# Patient Record
Sex: Female | Born: 1960 | Race: White | Hispanic: No | Marital: Married | Smoking: Never smoker
Health system: Southern US, Community
[De-identification: ages and names within clinical notes are randomized; demographics above are authoritative.]

## PROBLEM LIST (undated history)

## (undated) DIAGNOSIS — C801 Malignant (primary) neoplasm, unspecified: Secondary | ICD-10-CM

## (undated) DIAGNOSIS — Z973 Presence of spectacles and contact lenses: Secondary | ICD-10-CM

## (undated) HISTORY — PX: BREAST SURGERY: SHX581

## (undated) HISTORY — PX: WISDOM TOOTH EXTRACTION: SHX21

## (undated) HISTORY — PX: GUM SURGERY: SHX658

## (undated) HISTORY — DX: Malignant (primary) neoplasm, unspecified: C80.1

## (undated) HISTORY — DX: Presence of spectacles and contact lenses: Z97.3

## (undated) HISTORY — PX: DILATION AND CURETTAGE OF UTERUS: SHX78

---

## 2001-02-02 ENCOUNTER — Encounter: Payer: Self-pay | Admitting: Obstetrics and Gynecology

## 2001-02-02 ENCOUNTER — Encounter: Admission: RE | Admit: 2001-02-02 | Discharge: 2001-02-02 | Payer: Self-pay | Admitting: Obstetrics and Gynecology

## 2002-09-30 ENCOUNTER — Other Ambulatory Visit: Admission: RE | Admit: 2002-09-30 | Discharge: 2002-09-30 | Payer: Self-pay | Admitting: Obstetrics and Gynecology

## 2008-08-08 HISTORY — PX: WRIST SURGERY: SHX841

## 2011-03-21 ENCOUNTER — Other Ambulatory Visit: Payer: Self-pay | Admitting: Radiology

## 2011-03-22 ENCOUNTER — Other Ambulatory Visit: Payer: Self-pay | Admitting: Radiology

## 2011-03-22 DIAGNOSIS — C50912 Malignant neoplasm of unspecified site of left female breast: Secondary | ICD-10-CM

## 2011-03-28 ENCOUNTER — Other Ambulatory Visit: Payer: Self-pay

## 2011-03-28 ENCOUNTER — Ambulatory Visit (INDEPENDENT_AMBULATORY_CARE_PROVIDER_SITE_OTHER): Payer: BC Managed Care – PPO | Admitting: General Surgery

## 2011-03-28 ENCOUNTER — Other Ambulatory Visit (INDEPENDENT_AMBULATORY_CARE_PROVIDER_SITE_OTHER): Payer: Self-pay | Admitting: General Surgery

## 2011-03-28 ENCOUNTER — Encounter (INDEPENDENT_AMBULATORY_CARE_PROVIDER_SITE_OTHER): Payer: Self-pay | Admitting: General Surgery

## 2011-03-28 ENCOUNTER — Ambulatory Visit
Admission: RE | Admit: 2011-03-28 | Discharge: 2011-03-28 | Disposition: A | Discharge status: Home / Self Care | Payer: Self-pay | Source: Ambulatory Visit | Attending: Radiology | Admitting: Radiology

## 2011-03-28 VITALS — BP 128/76 | HR 62 | Temp 97.6°F

## 2011-03-28 DIAGNOSIS — C50319 Malignant neoplasm of lower-inner quadrant of unspecified female breast: Secondary | ICD-10-CM | POA: Insufficient documentation

## 2011-03-28 DIAGNOSIS — C50912 Malignant neoplasm of unspecified site of left female breast: Secondary | ICD-10-CM

## 2011-03-28 MED ORDER — GADOBENATE DIMEGLUMINE 529 MG/ML IV SOLN
11.0000 mL | Freq: Once | INTRAVENOUS | Status: AC | PRN
  Administered 2011-03-28: 11 mL via INTRAVENOUS

## 2011-03-28 NOTE — Progress Notes (Signed)
Chief Complaint  Patient presents with  . Other    new pt- breast cancer in left breast    HPI [name] is a 50 y.o. female.   HPI This is a 50 year old female who presents with a new diagnosis of a left breast cancer. She is very healthy. She does have a family history her mother age 106 of breast cancer. She underwent a lumpectomy and has been alive now for 20 years after her surgery. She underwent a screening mammogram on 03/16/2011. This showed a potential abnormality in the left breast that looked like it was in the medial portion of the breast. She then underwent spot compression views which showed it to be in the upper medial portion of the left breast. Ultrasound showed this to be a 4-5 mm irregularly marginated nodule at the 10:00 position in the left breast. She underwent an ultrasound-guided needle core biopsy. A clip was placed at the time. 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 She underwent additional stains with each adherence showing a ductal phenotype. This is a low-grade cancer. HER-2/neu is not amplified and her estrogen and progesterone receptors are both positive at 97%. Her Ki-67 is 15%. She reports no problems referable to her breasts. Her husband is a Sports administrator at [employer].  Past Medical History  Diagnosis Date  . Mammogram abnormal   . Wears glasses   . Cancer     breast    Past Surgical History  Procedure Date  . Wrist surgery 2010    repaired with plate and screws  . Dilation and curettage of uterus   . Wisdom tooth extraction   . Gum surgery     Family History  Problem Relation Age of Onset  . Cancer Mother     breast  . Cancer Father     prostate   . Hyperlipidemia Father   . Other Father     vertigo    Social History History  Substance Use Topics  . Smoking status: Never Smoker   . Smokeless tobacco: Not on file  . Alcohol Use: 8.4 oz/week    7 Glasses of wine, 7 Shots of liquor per week    No Known  Allergies  Current Outpatient Prescriptions  Medication Sig Dispense Refill  . aspirin 81 MG tablet Take 81 mg by mouth daily.        . Calcium Carbonate-Vitamin D (CALCIUM 600 + D PO) Take by mouth daily.        Marland Kitchen doxycycline (VIBRA-TABS) 100 MG tablet Every other day.      . fish oil-omega-3 fatty acids 1000 MG capsule Take 1,200 mg by mouth daily.        . hydroquinone 4 % cream Apply topically daily.        . Multiple Vitamin (MULTIVITAMIN PO) Take by mouth daily.        Kathrynn Running Estrad-Fe Biphas (LO LOESTRIN FE PO) Take by mouth daily. Pt temporarily not taking until blood work is done         Review of Systems Review of Systems  Constitutional: Negative.   HENT: Negative.   Eyes: Negative.   Respiratory: Negative.   Cardiovascular: Negative.   Gastrointestinal: Negative.   Genitourinary: Negative.   Musculoskeletal: Negative.   Skin: Negative.   Neurological: Negative.   Endo/Heme/Allergies: Negative.   Psychiatric/Behavioral: Negative.     Blood pressure 128/76, pulse 62, temperature 97.6 F (36.4 C).  Physical Exam Physical Exam  Constitutional: She appears  well-developed and well-nourished.  Neck: Neck supple. No thyromegaly present.  Cardiovascular: Normal rate, regular rhythm and normal heart sounds.   Respiratory: Effort normal and breath sounds normal. She has no wheezes. She has no rales. Right breast exhibits no inverted nipple, no mass, no nipple discharge, no skin change and no tenderness. Left breast exhibits no inverted nipple, no mass, no nipple discharge, no skin change and no tenderness. Breasts are symmetrical.    GI: Soft. She exhibits no mass.  Lymphadenopathy:    She has no cervical adenopathy.       Assessment    Early stage left breast cancer    Plan       We discussed the staging and pathophysiology of her breast cancer. There both very well first and breast cancer as her husband is a Sports administrator. She does have what looks like  a clinically early stage left breast cancer. We discussed several options from here.  I first recommended genetic testing for BRCA1 and BRCA2. Due to her family history as well as her young age I think this would be a reasonable plan. This would be important for both her treatment as well as the fact that she has 2 daughters. She has agreed to undergo genetic testing I will refer her to the cancer center. I'm going to try to see if they can draw all of her blood for her preop lab testing as well as some blood they were due to draw at her gynecologist this Friday as well.  We also discussed an MRI. She was scheduled today for an MRI prior to coming here. I think it would be reasonable to obtain an MRI on her due to the fact that she does have somewhat dense breasts, her young age, and her family history. We discussed the limitations as well as the complicating factors that can occur after an MRI. We all agreed it would be reasonable to proceed with the MRI that is scheduled. She is fully aware that this could lead to more biopsies and could just confuse the picture and may have no impact on survival.  I discussed all of the different treatment options for breast cancer including surgery, chemotherapy, radiation therapy, and anti estrogen therapy. We discussed all of these are options at this point. I told her that surgery is going to be her first treatment as it does appear she has an early stage breast cancer and the decision for all other therapies will be based on the staging after surgery. We discussed oncotype also.  I discussed with her  the role of sentinel node biopsy. I recommended this procedure to her. We discussed the performance of this procedure. I told her I would not do intraoperative pathology at the time that we would wait for the final results. We discussed about a 1-2% risk of lymphedema as well as chronic shoulder pain. I discussed there is a small chance she would need to return to the  operating room for a formal axillary lymph node dissection.  We discussed lumpectomy versus mastectomy. I think she is a good candidate for a lumpectomy. I discussed a 10-15% positive margin rate require requiring reexcision in the operating room. I also discussed 100% need for radiation therapy following lumpectomy due to the recurrence rates. I also discussed a mastectomy. We discussed a small difference in the local recurrence rate after lumpectomy and radiation versus a mastectomy. I also discussed there was no difference in her survival rate between the 2 surgeries.  She would very much like to undergo a lumpectomy and will plan on scheduling for that as well as a sentinel lymph node biopsy was beginning to genetic testing completed. We also discussed external beam radiation therapy versus MammoSite. We have elected to undergo standard radiation therapy.    Indya Oliveria 03/28/2011, 12:33 PM

## 2011-04-04 ENCOUNTER — Encounter (HOSPITAL_BASED_OUTPATIENT_CLINIC_OR_DEPARTMENT_OTHER): Payer: BC Managed Care – PPO | Admitting: Genetic Counselor

## 2011-04-04 ENCOUNTER — Other Ambulatory Visit: Payer: Self-pay | Admitting: Oncology

## 2011-04-04 DIAGNOSIS — C50919 Malignant neoplasm of unspecified site of unspecified female breast: Secondary | ICD-10-CM

## 2011-04-04 LAB — CBC WITH DIFFERENTIAL/PLATELET
Basophils Absolute: 0 10*3/uL (ref 0.0–0.1)
EOS%: 5 % (ref 0.0–7.0)
HCT: 39.7 % (ref 34.8–46.6)
HGB: 13.8 g/dL (ref 11.6–15.9)
LYMPH%: 47.8 % (ref 14.0–49.7)
MCH: 34 pg (ref 25.1–34.0)
NEUT%: 40.1 % (ref 38.4–76.8)
Platelets: 195 10*3/uL (ref 145–400)
lymph#: 2.1 10*3/uL (ref 0.9–3.3)

## 2011-04-04 LAB — LIPID PANEL: Cholesterol: 174 mg/dL (ref 0–200)

## 2011-04-04 LAB — COMPREHENSIVE METABOLIC PANEL
ALT: 27 U/L (ref 0–35)
CO2: 26 mEq/L (ref 19–32)
Calcium: 9.6 mg/dL (ref 8.4–10.5)
Chloride: 104 mEq/L (ref 96–112)
Creatinine, Ser: 0.77 mg/dL (ref 0.50–1.10)
Sodium: 142 mEq/L (ref 135–145)
Total Protein: 6.8 g/dL (ref 6.0–8.3)

## 2011-04-04 LAB — TSH: TSH: 0.964 u[IU]/mL (ref 0.350–4.500)

## 2011-04-14 ENCOUNTER — Other Ambulatory Visit (INDEPENDENT_AMBULATORY_CARE_PROVIDER_SITE_OTHER): Payer: Self-pay | Admitting: General Surgery

## 2011-04-14 ENCOUNTER — Encounter (HOSPITAL_BASED_OUTPATIENT_CLINIC_OR_DEPARTMENT_OTHER)
Admission: RE | Admit: 2011-04-14 | Discharge: 2011-04-14 | Disposition: A | Discharge status: Home / Self Care | Payer: BC Managed Care – PPO | Source: Ambulatory Visit | Attending: General Surgery | Admitting: General Surgery

## 2011-04-14 ENCOUNTER — Ambulatory Visit
Admission: RE | Admit: 2011-04-14 | Discharge: 2011-04-14 | Disposition: A | Discharge status: Home / Self Care | Payer: BC Managed Care – PPO | Source: Ambulatory Visit | Attending: General Surgery | Admitting: General Surgery

## 2011-04-14 DIAGNOSIS — Z01811 Encounter for preprocedural respiratory examination: Secondary | ICD-10-CM

## 2011-04-18 ENCOUNTER — Telehealth (INDEPENDENT_AMBULATORY_CARE_PROVIDER_SITE_OTHER): Payer: Self-pay | Admitting: General Surgery

## 2011-04-18 NOTE — Telephone Encounter (Signed)
Calling to check on BRCA test. LM for her to call me back.

## 2011-04-19 ENCOUNTER — Other Ambulatory Visit (INDEPENDENT_AMBULATORY_CARE_PROVIDER_SITE_OTHER): Payer: Self-pay | Admitting: General Surgery

## 2011-04-19 ENCOUNTER — Ambulatory Visit (HOSPITAL_COMMUNITY)
Admission: RE | Admit: 2011-04-19 | Discharge: 2011-04-19 | Disposition: A | Discharge status: Home / Self Care | Payer: BC Managed Care – PPO | Source: Ambulatory Visit | Attending: General Surgery | Admitting: General Surgery

## 2011-04-19 ENCOUNTER — Ambulatory Visit (HOSPITAL_BASED_OUTPATIENT_CLINIC_OR_DEPARTMENT_OTHER)
Admission: RE | Admit: 2011-04-19 | Discharge: 2011-04-19 | Disposition: A | Discharge status: Home / Self Care | Payer: BC Managed Care – PPO | Source: Ambulatory Visit | Attending: General Surgery | Admitting: General Surgery

## 2011-04-19 DIAGNOSIS — C50912 Malignant neoplasm of unspecified site of left female breast: Secondary | ICD-10-CM

## 2011-04-19 DIAGNOSIS — Z01810 Encounter for preprocedural cardiovascular examination: Secondary | ICD-10-CM | POA: Insufficient documentation

## 2011-04-19 DIAGNOSIS — C50919 Malignant neoplasm of unspecified site of unspecified female breast: Secondary | ICD-10-CM | POA: Insufficient documentation

## 2011-04-19 DIAGNOSIS — Z01812 Encounter for preprocedural laboratory examination: Secondary | ICD-10-CM | POA: Insufficient documentation

## 2011-04-19 DIAGNOSIS — Z01818 Encounter for other preprocedural examination: Secondary | ICD-10-CM | POA: Insufficient documentation

## 2011-04-19 MED ORDER — TECHNETIUM TC 99M SULFUR COLLOID FILTERED
1.0000 | Freq: Once | INTRAVENOUS | Status: AC | PRN
  Administered 2011-04-19: 1 via INTRADERMAL

## 2011-04-19 NOTE — Op Note (Addendum)
[name], [name]             ACCOUNT NO.:  [id number]  MEDICAL RECORD NO.:  [id number]  LOCATION:  [facility]                          FACILITY:  [facility]  PHYSICIAN:  Juanetta Gosling, MDDATE OF BIRTH:  02-08-1961  DATE OF PROCEDURE:  04/19/2011 DATE OF DISCHARGE:                              OPERATIVE REPORT   PREOPERATIVE DIAGNOSIS:  Clinical stage I left breast cancer.  POSTOPERATIVE DIAGNOSIS:  Clinical stage I left breast cancer.  PROCEDURES: 1. Left breast wire-guided lumpectomy. 2. Injection of methylene blue dye for sentinel node identification. 3. Left axillary sentinel node biopsy.  SURGEON:  Juanetta Gosling, MD  ASSISTANT:  None.  ANESTHESIA:  General.  SPECIMENS: 1. Left breast marked with paint kit. 2. Left axillary sentinel node packet with highest count of 1681.  DISPOSITION OF SPECIMEN:  To Pathology.  ESTIMATED BLOOD LOSS:  Minimal.  COMPLICATIONS:  None.  DRAINS:  None.  DISPOSITION:  The patient went to recovery room in stable condition.  INDICATIONS:  This is a 50 year old female with a newly diagnosed left breast cancer that appeared minimal to breast conservation therapy.  We discussed our options and elected to proceed with lumpectomy and sentinel node biopsy.  DESCRIPTION OF PROCEDURE:  After informed consent was obtained, the patient was taken to the Breast Center where she had a wire placed by Dr. Tilda Burrow, had the mammograms available for my review and a mark was placed overlying the lesion.  She was then brought to the [facility].  Technetium administered in a standard periareolar fashion. She was then administered 1 g of intravenous cefazolin and sequential compression devices were placed.  She was then placed under general anesthesia with an LMA.  Her left breast was then prepped and draped in a standard sterile surgical fashion.  A surgical time-out was then performed.  This was in the lower inner quadrant of her left  breast.  I made a radial incision overlying the lesion and pulled the wire and remotely, I excised the lesion in the surrounding tissue.  This was done down to the pectoralis muscle, so the deep margin is the muscle.  Faxitron mammogram was taken.  The clip in the lesion were right in the middle of the specimen.  I then packed this with a sponge.  I then made an incision in her axilla right below her hairline, carried this through the axillary fascia.  I then noted a small packet of sentinel node, there were 2 or 3 nodes in this packet with the highest count being 1681.  The background radioactivity was then 3.  The two of the appeared to be blue as well. These were excised and passed off the table as sentinel node #1.  I packed this with a sponge.  I then went in and placed 2 clips deep and 1 clip in each cardinal position around the cavity of lumpectomy.  I then closed this with 2-0 Vicryl, 3-0 Vicryl, 4-0 Monocryl.  I then returned to the axilla.  I ensured that there was hemostasis.  I did place a couple of clips on the small vessel.  Once this was done, I closed the axillary fascia with a 2-0 Vicryl,  then a 3-0 Vicryl, then a 4-0 Monocryl.  Steri-Strips and sterile dressing were placed on both.  The breast binder was placed.  She tolerated this well, was extubated, and transferred to the recovery room in stable condition.     Juanetta Gosling, MD     MCW/MEDQ  D:  04/19/2011  T:  04/19/2011  Job:  [id number]  cc:   Lynden Ang, NP Lurline Hare, M.D. [name], M.D. Rogelia Mire, MD  Electronically Signed by Emelia Loron MD on 04/26/2011 01:39:48 PM

## 2011-04-19 NOTE — Telephone Encounter (Signed)
[name] called back making me aware that patient went to [facility] to have done due to no insurance and they will not have results back until November/December. She will let us know when she gets them. Dr Dwain Sarna made aware.

## 2011-04-21 ENCOUNTER — Other Ambulatory Visit (INDEPENDENT_AMBULATORY_CARE_PROVIDER_SITE_OTHER): Payer: Self-pay | Admitting: General Surgery

## 2011-04-21 DIAGNOSIS — C50319 Malignant neoplasm of lower-inner quadrant of unspecified female breast: Secondary | ICD-10-CM

## 2011-05-03 ENCOUNTER — Ambulatory Visit (INDEPENDENT_AMBULATORY_CARE_PROVIDER_SITE_OTHER): Payer: BC Managed Care – PPO | Admitting: General Surgery

## 2011-05-03 ENCOUNTER — Encounter (INDEPENDENT_AMBULATORY_CARE_PROVIDER_SITE_OTHER): Payer: Self-pay | Admitting: General Surgery

## 2011-05-03 VITALS — BP 122/86 | HR 72 | Temp 98.0°F | Resp 12 | Ht 65.0 in | Wt 122.6 lb

## 2011-05-03 DIAGNOSIS — Z09 Encounter for follow-up examination after completed treatment for conditions other than malignant neoplasm: Secondary | ICD-10-CM

## 2011-05-03 NOTE — Progress Notes (Signed)
Subjective:     Patient ID: [name], female   DOB: November 01, 1960, 50 y.o.   MRN: [id number]  HPI  This is a 50 year old female with a newly diagnosed left breast cancer. She underwent recently a left breast wire-guided lumpectomy as well as a sentinel lymph node biopsy. She returns today complaining of only some swelling in her left axilla as well as a knot that she feels. Her pathology returned as a low-grade invasive ductal carcinoma measuring less than a centimeter with a negative sentinel node giving her a stage I left breast cancer that is hormone receptor positive. She is due to see radiation oncology and medical oncology in the next week. She is otherwise to return to most of her normal activities.  Review of Systems     Objective:   Physical Exam Healing left breast incision without infection, left axillary swelling consistent with a small seroma and a palpable knot on subcuticular stitch, no infection    Assessment:     Stage I left breast cancer s/p surgery with negative margin and negative nodal evaluation    Plan:     I discussed with her that she does have a small seroma in her axilla we should just reabsorb on its own. I discussed with her that she can begin starting to massage these areas with vitamin E lotion and hopefully this will get these incisions to smooth out over some time. I released her full activity at this point. She is going to see Dr. Michell Heinrich tomorrow and Dr. Darnelle Catalan next week for evaluation for further adjuvant therapy. I think she signed to proceed with this adjuvant therapy whenever they are ready. I asked her to come back and see me in about 4 months when she is done with radiation therapy just for examination.

## 2011-05-04 ENCOUNTER — Ambulatory Visit
Admission: RE | Admit: 2011-05-04 | Discharge: 2011-05-04 | Disposition: A | Discharge status: Home / Self Care | Payer: BC Managed Care – PPO | Source: Ambulatory Visit | Attending: Radiation Oncology | Admitting: Radiation Oncology

## 2011-05-04 DIAGNOSIS — Z510 Encounter for antineoplastic radiation therapy: Secondary | ICD-10-CM | POA: Insufficient documentation

## 2011-05-04 DIAGNOSIS — C50319 Malignant neoplasm of lower-inner quadrant of unspecified female breast: Secondary | ICD-10-CM | POA: Insufficient documentation

## 2011-05-12 ENCOUNTER — Other Ambulatory Visit: Payer: Self-pay | Admitting: Oncology

## 2011-05-12 ENCOUNTER — Encounter (HOSPITAL_BASED_OUTPATIENT_CLINIC_OR_DEPARTMENT_OTHER): Payer: BC Managed Care – PPO | Admitting: Oncology

## 2011-05-12 DIAGNOSIS — C50319 Malignant neoplasm of lower-inner quadrant of unspecified female breast: Secondary | ICD-10-CM

## 2011-05-12 LAB — COMPREHENSIVE METABOLIC PANEL
CO2: 27 mEq/L (ref 19–32)
Creatinine, Ser: 0.69 mg/dL (ref 0.50–1.10)
Glucose, Bld: 91 mg/dL (ref 70–99)
Sodium: 137 mEq/L (ref 135–145)
Total Bilirubin: 0.3 mg/dL (ref 0.3–1.2)
Total Protein: 6.8 g/dL (ref 6.0–8.3)

## 2011-05-12 LAB — CBC WITH DIFFERENTIAL/PLATELET
Eosinophils Absolute: 0.2 10*3/uL (ref 0.0–0.5)
HCT: 36.7 % (ref 34.8–46.6)
LYMPH%: 39.9 % (ref 14.0–49.7)
MONO#: 0.3 10*3/uL (ref 0.1–0.9)
NEUT#: 2.4 10*3/uL (ref 1.5–6.5)
NEUT%: 48.4 % (ref 38.4–76.8)
Platelets: 217 10*3/uL (ref 145–400)
WBC: 4.9 10*3/uL (ref 3.9–10.3)

## 2011-05-12 LAB — CANCER ANTIGEN 27.29: CA 27.29: 37 U/mL (ref 0–39)

## 2011-05-31 ENCOUNTER — Encounter: Payer: Self-pay | Admitting: *Deleted

## 2011-06-13 ENCOUNTER — Encounter: Payer: Self-pay | Admitting: Internal Medicine

## 2011-06-13 ENCOUNTER — Ambulatory Visit
Admission: RE | Admit: 2011-06-13 | Discharge: 2011-06-13 | Disposition: A | Discharge status: Home / Self Care | Payer: BC Managed Care – PPO | Source: Ambulatory Visit | Attending: Radiation Oncology | Admitting: Radiation Oncology

## 2011-06-14 ENCOUNTER — Ambulatory Visit
Admission: RE | Admit: 2011-06-14 | Discharge: 2011-06-14 | Disposition: A | Discharge status: Home / Self Care | Payer: BC Managed Care – PPO | Source: Ambulatory Visit | Attending: Radiation Oncology | Admitting: Radiation Oncology

## 2011-06-14 VITALS — Wt 121.6 lb

## 2011-06-14 DIAGNOSIS — C50319 Malignant neoplasm of lower-inner quadrant of unspecified female breast: Secondary | ICD-10-CM

## 2011-06-14 NOTE — Progress Notes (Deleted)
Weekly Management Note Current Dose:27 Gy  Projected Dose: 61 Gy   Narrative:  The patient presents for routine under treatment assessment.  CBCT/MVCT images/Port film x-rays were reviewed.  The chart was checked. Fatigue. Nausea in am. Eating more. Taking zantac in am.   Physical Findings: Slightly dark right breast. 121 lb 9.6 oz (55.157 kg)  Impression:  The patient is tolerating radiation.  Plan:  Continue treatment as planned.Try zantac bid.

## 2011-06-14 NOTE — Progress Notes (Signed)
Weekly Management Note Current Dose: 27 Gy  Projected Dose: 61 Gy   Narrative:  The patient presents for routine under treatment assessment.  CBCT/MVCT images/Port film x-rays were reviewed.  The chart was checked. Doing well. Some soreness. Using sports bra and pillow at night.   Physical Findings: Some skin redness. Stitch still visible below the skin in the axilla.  121 lb 9.6 oz (55.157 kg)   Impression:  The patient is tolerating radiation.  Plan:  Continue treatment as planned. OK to use anti-inflammatory for pain.

## 2011-06-14 NOTE — Progress Notes (Signed)
Encounter addended by: Lurline Hare, MD on: 06/14/2011  4:50 PM<BR>     Documentation filed: Marzetta Merino Staging, Visit Diagnoses, Notes Section

## 2011-06-14 NOTE — Progress Notes (Signed)
Encounter addended by: Lurline Hare, MD on: 06/14/2011  4:46 PM<BR>     Documentation filed: Notes Section, Visit Diagnoses

## 2011-06-15 ENCOUNTER — Ambulatory Visit
Admission: RE | Admit: 2011-06-15 | Discharge: 2011-06-15 | Disposition: A | Discharge status: Home / Self Care | Payer: BC Managed Care – PPO | Source: Ambulatory Visit | Attending: Radiation Oncology | Admitting: Radiation Oncology

## 2011-06-16 ENCOUNTER — Ambulatory Visit
Admission: RE | Admit: 2011-06-16 | Discharge: 2011-06-16 | Disposition: A | Discharge status: Home / Self Care | Payer: BC Managed Care – PPO | Source: Ambulatory Visit | Attending: Radiation Oncology | Admitting: Radiation Oncology

## 2011-06-17 ENCOUNTER — Ambulatory Visit
Admission: RE | Admit: 2011-06-17 | Discharge: 2011-06-17 | Disposition: A | Discharge status: Home / Self Care | Payer: BC Managed Care – PPO | Source: Ambulatory Visit | Attending: Radiation Oncology | Admitting: Radiation Oncology

## 2011-06-20 ENCOUNTER — Ambulatory Visit
Admission: RE | Admit: 2011-06-20 | Discharge: 2011-06-20 | Disposition: A | Discharge status: Home / Self Care | Payer: BC Managed Care – PPO | Source: Ambulatory Visit | Attending: Radiation Oncology | Admitting: Radiation Oncology

## 2011-06-21 ENCOUNTER — Ambulatory Visit
Admission: RE | Admit: 2011-06-21 | Discharge: 2011-06-21 | Disposition: A | Discharge status: Home / Self Care | Payer: BC Managed Care – PPO | Source: Ambulatory Visit | Attending: Radiation Oncology | Admitting: Radiation Oncology

## 2011-06-21 DIAGNOSIS — C50319 Malignant neoplasm of lower-inner quadrant of unspecified female breast: Secondary | ICD-10-CM

## 2011-06-21 NOTE — Progress Notes (Signed)
Pt reports skin in tx area "a little itchy". Advised she may apply cortisone prn.

## 2011-06-21 NOTE — Progress Notes (Signed)
Weekly Management Note Current Dose: 36 Gy  Projected Dose:61 Gy   Narrative:  The patient presents for routine under treatment assessment.  CBCT/MVCT images/Port film x-rays were reviewed.  The chart was checked. Doing well. Skin "itchy" but no pain. Using radiaplex.  Physical Findings: Weight: declined. Pink skin. No desquamation.   Impression:  The patient is tolerating radiation.  Plan:  Continue treatment as planned. Continue radiaplex. Add cortisone if needed.

## 2011-06-22 ENCOUNTER — Ambulatory Visit
Admission: RE | Admit: 2011-06-22 | Discharge: 2011-06-22 | Disposition: A | Discharge status: Home / Self Care | Payer: BC Managed Care – PPO | Source: Ambulatory Visit | Attending: Radiation Oncology | Admitting: Radiation Oncology

## 2011-06-23 ENCOUNTER — Ambulatory Visit
Admission: RE | Admit: 2011-06-23 | Discharge: 2011-06-23 | Disposition: A | Discharge status: Home / Self Care | Payer: BC Managed Care – PPO | Source: Ambulatory Visit | Attending: Radiation Oncology | Admitting: Radiation Oncology

## 2011-06-24 ENCOUNTER — Ambulatory Visit
Admission: RE | Admit: 2011-06-24 | Discharge: 2011-06-24 | Disposition: A | Discharge status: Home / Self Care | Payer: BC Managed Care – PPO | Source: Ambulatory Visit | Attending: Radiation Oncology | Admitting: Radiation Oncology

## 2011-06-25 ENCOUNTER — Ambulatory Visit
Admission: RE | Admit: 2011-06-25 | Discharge: 2011-06-25 | Disposition: A | Discharge status: Home / Self Care | Payer: BC Managed Care – PPO | Source: Ambulatory Visit | Attending: Radiation Oncology | Admitting: Radiation Oncology

## 2011-06-27 ENCOUNTER — Ambulatory Visit
Admission: RE | Admit: 2011-06-27 | Discharge: 2011-06-27 | Disposition: A | Discharge status: Home / Self Care | Payer: BC Managed Care – PPO | Source: Ambulatory Visit | Attending: Radiation Oncology | Admitting: Radiation Oncology

## 2011-06-27 VITALS — Wt 124.3 lb

## 2011-06-27 DIAGNOSIS — C50319 Malignant neoplasm of lower-inner quadrant of unspecified female breast: Secondary | ICD-10-CM

## 2011-06-27 NOTE — Progress Notes (Signed)
[facility] Radiation Oncology Weekly Treatment Note    Name: [name] Date: 06/27/2011 MRN: [id number] DOB: 01-07-1961  Status:outpatient    Current dose: 4500cGy  Current fraction:25  Planned dose:6100cGy  Planned fraction:33   ALLERGIES: Review of patient's allergies indicates no known allergies.    NARRATIVE: [name] was seen today for weekly treatment management. The chart was checked and port films images were reviewed. She is without complaints today except for some pruritis and discomfort along the left breast. She has been using Radioplex gel.  PHYSICAL EXAMINATION: weight is 124 lb 4.8 oz (56.382 kg).        Tthere is moderate erythema of the skin along the left breast but no areas of desquamation.    ASSESSMENT: Patient tolerating treatments well. 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   PLAN: Continue treatment as planned. Start hydrocortisone cream when necessary.

## 2011-06-27 NOTE — Progress Notes (Signed)
Has completed 25/25 treatment to left breast. Skin mildly reddened. No breaks in skin.  Continue radiaplex.

## 2011-06-28 ENCOUNTER — Ambulatory Visit
Admission: RE | Admit: 2011-06-28 | Discharge: 2011-06-28 | Disposition: A | Discharge status: Home / Self Care | Payer: BC Managed Care – PPO | Source: Ambulatory Visit | Attending: Radiation Oncology | Admitting: Radiation Oncology

## 2011-06-28 ENCOUNTER — Ambulatory Visit: Payer: BC Managed Care – PPO | Admitting: Radiation Oncology

## 2011-06-29 ENCOUNTER — Ambulatory Visit
Admission: RE | Admit: 2011-06-29 | Discharge: 2011-06-29 | Disposition: A | Discharge status: Home / Self Care | Payer: BC Managed Care – PPO | Source: Ambulatory Visit | Attending: Radiation Oncology | Admitting: Radiation Oncology

## 2011-07-01 ENCOUNTER — Ambulatory Visit: Payer: BC Managed Care – PPO

## 2011-07-04 ENCOUNTER — Ambulatory Visit
Admission: RE | Admit: 2011-07-04 | Discharge: 2011-07-04 | Disposition: A | Discharge status: Home / Self Care | Payer: BC Managed Care – PPO | Source: Ambulatory Visit | Attending: Radiation Oncology | Admitting: Radiation Oncology

## 2011-07-05 ENCOUNTER — Ambulatory Visit
Admission: RE | Admit: 2011-07-05 | Discharge: 2011-07-05 | Disposition: A | Discharge status: Home / Self Care | Payer: BC Managed Care – PPO | Source: Ambulatory Visit | Attending: Radiation Oncology | Admitting: Radiation Oncology

## 2011-07-05 VITALS — Wt 122.0 lb

## 2011-07-05 DIAGNOSIS — C50319 Malignant neoplasm of lower-inner quadrant of unspecified female breast: Secondary | ICD-10-CM

## 2011-07-05 MED ORDER — RADIAPLEXRX EX GEL
Freq: Once | CUTANEOUS | Status: AC
  Administered 2011-07-05: 17:00:00 via TOPICAL

## 2011-07-05 NOTE — Progress Notes (Signed)
Weekly Management Note Current Dose: 53  Gy  Projected Dose:61  Gy   Narrative:  The patient presents for routine under treatment assessment.  CBCT/MVCT images/Port film x-rays were reviewed.  The chart was checked. Doing well. Nipple is raw and irritated. Otherwise breast is red. Using cortisone and radiaplex.  Physical Findings: Weight: 122 lb (55.339 kg). Dry desquamation over breast and nipple.   Impression:  The patient is tolerating radiation.  Plan:  Continue treatment as planned. Add aquaphor to nipple and continue radiaplex. F/u in 1 month.

## 2011-07-05 NOTE — Progress Notes (Signed)
Left breast bright erythema  Nipple area raw looking, under fold of breast skin peeling 2-3c, tenderness, itching,uses cortisone cream and radiaplex cream, 4 more txs, FYNN pamphlet given to pt to think over,  4:02 PM

## 2011-07-06 ENCOUNTER — Ambulatory Visit
Admission: RE | Admit: 2011-07-06 | Discharge: 2011-07-06 | Disposition: A | Discharge status: Home / Self Care | Payer: BC Managed Care – PPO | Source: Ambulatory Visit | Attending: Radiation Oncology | Admitting: Radiation Oncology

## 2011-07-07 ENCOUNTER — Ambulatory Visit
Admission: RE | Admit: 2011-07-07 | Discharge: 2011-07-07 | Disposition: A | Discharge status: Home / Self Care | Payer: BC Managed Care – PPO | Source: Ambulatory Visit | Attending: Radiation Oncology | Admitting: Radiation Oncology

## 2011-07-08 ENCOUNTER — Ambulatory Visit
Admission: RE | Admit: 2011-07-08 | Discharge: 2011-07-08 | Disposition: A | Discharge status: Home / Self Care | Payer: BC Managed Care – PPO | Source: Ambulatory Visit | Attending: Radiation Oncology | Admitting: Radiation Oncology

## 2011-07-11 ENCOUNTER — Ambulatory Visit
Admission: RE | Admit: 2011-07-11 | Discharge: 2011-07-11 | Disposition: A | Discharge status: Home / Self Care | Payer: BC Managed Care – PPO | Source: Ambulatory Visit | Attending: Radiation Oncology | Admitting: Radiation Oncology

## 2011-07-11 ENCOUNTER — Ambulatory Visit (AMBULATORY_SURGERY_CENTER): Payer: BC Managed Care – PPO | Admitting: *Deleted

## 2011-07-11 VITALS — Ht 65.0 in | Wt 120.0 lb

## 2011-07-11 DIAGNOSIS — Z1211 Encounter for screening for malignant neoplasm of colon: Secondary | ICD-10-CM

## 2011-07-11 MED ORDER — PEG-KCL-NACL-NASULF-NA ASC-C 100 G PO SOLR: ORAL | Status: DC

## 2011-07-24 ENCOUNTER — Encounter: Payer: Self-pay | Admitting: Radiation Oncology

## 2011-07-24 DIAGNOSIS — C50319 Malignant neoplasm of lower-inner quadrant of unspecified female breast: Secondary | ICD-10-CM

## 2011-07-24 NOTE — Procedures (Signed)
DIAGNOSIS:  Left breast cancer.  NARRATIVE:  Ms. Foutz underwent verification simulation on 05/24/2011.    ______________________________ Lurline Hare, M.D. SW/MEDQ  D:  07/24/2011  T:  07/24/2011  Job:  [id number]

## 2011-07-24 NOTE — Progress Notes (Signed)
Name: [name]   MRN: [id number]  Date: 06/24/2011  DOB: 1961/05/04  Status:outpatient    DIAGNOSIS: Breast cancer.  CONSENT VERIFIED: yes   SET UP: Patient is setup supine   IMMOBILIZATION:  The following immobilization was used:Custom Moldable Pillow, breast board.   NARRATIVE: Bretta Bang underwent complex simulation and treatment planning for her boost treatment today.  Her tumor volume was outlined on the planning CT scan.  Due to the depth of her cavity, electrons were used. A special port plan is requested    1  field will be used with a block comprising 1  treatment device.

## 2011-07-25 ENCOUNTER — Ambulatory Visit (AMBULATORY_SURGERY_CENTER): Payer: BC Managed Care – PPO | Admitting: Internal Medicine

## 2011-07-25 ENCOUNTER — Encounter: Payer: Self-pay | Admitting: Internal Medicine

## 2011-07-25 VITALS — BP 113/76 | HR 65 | Temp 98.5°F | Resp 17 | Ht 65.0 in | Wt 120.0 lb

## 2011-07-25 DIAGNOSIS — Z1211 Encounter for screening for malignant neoplasm of colon: Secondary | ICD-10-CM

## 2011-07-25 MED ORDER — SODIUM CHLORIDE 0.9 % IV SOLN: 500.0000 mL | INTRAVENOUS | Status: DC

## 2011-07-25 NOTE — Patient Instructions (Signed)
Please refer to your blue and neon green sheets for instructions regarding diet and activity for the rest of today.  You may resume your medications as you would normally take them.  

## 2011-07-25 NOTE — Op Note (Signed)
[facility] [address] [place], [place]  [address]  COLONOSCOPY PROCEDURE REPORT  PATIENT:  [name], [name]  MR#:  [id number] BIRTHDATE:  [birth year], 50 yrs. old  GENDER:  female ENDOSCOPIST:  Hedwig Morton. Juanda Chance, MD REF. BY:  Shea Evans, M.D. PROCEDURE DATE:  07/25/2011 PROCEDURE:  Colonoscopy 30160 ASA CLASS:  Class I INDICATIONS:  colorectal cancer screening, average risk MEDICATIONS:   These medications were titrated to patient response per physician's verbal order, Fentanyl 10 mg, Fentanyl 100 mcg  DESCRIPTION OF PROCEDURE:   After the risks and benefits and of the procedure were explained, informed consent was obtained. Digital rectal exam was performed and revealed no rectal masses. The LB [id number]-[id number] endoscope was introduced through the anus and advanced to the cecum, which was identified by both the appendix and ileocecal valve.  The quality of the prep was good, using MoviPrep.  The instrument was then slowly withdrawn as the colon was fully examined. <<PROCEDUREIMAGES>>  FINDINGS:  No polyps or cancers were seen (see image2, image3, image4, and image5).  Scattered diverticula were found (see image1).   Retroflexed views in the rectum revealed no abnormalities.    The scope was then withdrawn from the patient and the procedure completed.  COMPLICATIONS:  None ENDOSCOPIC IMPRESSION: 1) No polyps or cancers 2) Diverticula, scattered RECOMMENDATIONS: 1) High fiber diet.  REPEAT EXAM:  In 10 year(s) for.  ______________________________ Hedwig Morton. Juanda Chance, MD  CC:  n. eSIGNED:   Hedwig Morton. Pasquale Matters at 07/25/2011 11:33 AM  Lowella Dandy, [phone]

## 2011-07-25 NOTE — Progress Notes (Signed)
CC:   [name], M.D. [name], MD Juanetta Gosling, MD  DIAGNOSIS:  T1 N0 grade 1 invasive ductal carcinoma of the left breast.  TREATMENT DATES:  05/25/2011 to 07/11/2011.  ANATOMIC REGION TREATED: 1. Left breast. 2. Left breast boost.  DOSE: 1. 45 Gy at 1.8 Gy per fraction x25 fractions. 2. 16 Gy at 2 Gy per fraction x8 fractions.  BEAM ARRANGEMENT: 1. Opposed tangents with reduced fields. 2. En face electrons.  BEAM ENERGY: 1. 6 mV photons. 2. 9 MeV electrons.  NARRATIVE:  Kaysia really did well in terms of skin toxicity.  She was using RadiaPlex and really just had symptoms of an irritated nipple. We added Aquaphor towards the end of her treatment, and I will plan on seeing her back in 1 month.  She knows to contact us with any questions or concerns in the interim.    ______________________________ Lurline Hare, M.D. SW/MEDQ  D:  07/24/2011  T:  07/24/2011  Job:  [id number]

## 2011-07-25 NOTE — Progress Notes (Signed)
Patient did not experience any of the following events: a burn prior to discharge; a fall within the facility; wrong site/side/patient/procedure/implant event; or a hospital transfer or hospital admission upon discharge from the facility. (G8907) Patient did not have preoperative order for IV antibiotic SSI prophylaxis. (G8918)  

## 2011-07-26 ENCOUNTER — Telehealth: Payer: Self-pay | Admitting: *Deleted

## 2011-07-26 NOTE — Telephone Encounter (Signed)

## 2011-08-18 ENCOUNTER — Ambulatory Visit
Admission: RE | Admit: 2011-08-18 | Discharge: 2011-08-18 | Disposition: A | Discharge status: Home / Self Care | Payer: BC Managed Care – PPO | Source: Ambulatory Visit | Attending: Radiation Oncology | Admitting: Radiation Oncology

## 2011-08-18 ENCOUNTER — Encounter: Payer: Self-pay | Admitting: Radiation Oncology

## 2011-08-18 DIAGNOSIS — C50319 Malignant neoplasm of lower-inner quadrant of unspecified female breast: Secondary | ICD-10-CM

## 2011-08-18 NOTE — Progress Notes (Signed)
CC:   Julia Gosling, MD [name], M.D. [name], MD  DIAGNOSIS:  T1 N0 grade 1 invasive ductal carcinoma.  Previous radiation to a total dose of 61 Gy completed 07/11/2011.  INTERVAL SINCE TREATMENT:  1 month.  INTERVAL HISTORY:  [name] reports for followup today.  She has really had no problems.  She is using lotion with vitamin E.  Her skin really is healing up nicely.  She has not had any side effects from tamoxifen. Her breast is somewhat sore and she is still wearing a sports bra. Other than that, she is feeling and doing well.  PHYSICAL EXAMINATION:  She is pleasant female in no distress sitting comfortably on the exam room table.  Her left breast is slightly hyperpigmented as compared to the right.  IMPRESSION:  T1 N0 grade 1 left breast cancer status post breast conservation with resolving acute effects of treatment.  RECOMMENDATIONS:  [name] looks great.  She has regular scheduled followup with Dr. Darnelle Catalan.  I have released her from followup with me. I would be happy to see her back at any point in the future.    ______________________________ Lurline Hare, M.D. SW/MEDQ  D:  08/18/2011  T:  08/18/2011  Job:  [id number]

## 2011-08-18 NOTE — Progress Notes (Signed)
NO C/O TODAY AT FU.

## 2011-09-01 ENCOUNTER — Encounter (INDEPENDENT_AMBULATORY_CARE_PROVIDER_SITE_OTHER): Payer: Self-pay | Admitting: General Surgery

## 2011-09-10 ENCOUNTER — Telehealth: Payer: Self-pay | Admitting: Oncology

## 2011-09-10 NOTE — Telephone Encounter (Signed)
lmonvm for pt re appt for 2/13. Schedule mailed.

## 2011-09-15 ENCOUNTER — Telehealth: Payer: Self-pay | Admitting: Genetic Counselor

## 2011-09-16 ENCOUNTER — Encounter (INDEPENDENT_AMBULATORY_CARE_PROVIDER_SITE_OTHER): Payer: Self-pay | Admitting: General Surgery

## 2011-09-16 ENCOUNTER — Ambulatory Visit (INDEPENDENT_AMBULATORY_CARE_PROVIDER_SITE_OTHER): Payer: BC Managed Care – PPO | Admitting: General Surgery

## 2011-09-16 VITALS — BP 102/66 | HR 60 | Temp 97.8°F | Resp 18 | Ht 65.25 in | Wt 121.6 lb

## 2011-09-16 DIAGNOSIS — Z853 Personal history of malignant neoplasm of breast: Secondary | ICD-10-CM

## 2011-09-16 NOTE — Progress Notes (Signed)
Subjective:     Patient ID: [name], female   DOB: 1961-07-30, 51 y.o.   MRN: [id number]  HPI This is a 51 year old female who underwent a left breast lumpectomy and sentinel lymph node biopsy for a stage I hormone receptor positive tumor. She has completed radiation therapy in early December. She is now on tamoxifen. She is tolerating this very well right now. She has no real complaints referable to her breasts. She does have some changes associated with radiation therapy that have been slowly improving.  Review of Systems     Objective:   Physical Exam  Vitals reviewed. Pulmonary/Chest: Right breast exhibits no inverted nipple, no mass, no nipple discharge, no skin change and no tenderness. Left breast exhibits no inverted nipple, no mass, no nipple discharge, no skin change and no tenderness. Breasts are symmetrical.    Lymphadenopathy:    She has no cervical adenopathy.    She has no axillary adenopathy.       Right: No supraclavicular adenopathy present.       Left: No supraclavicular adenopathy present.       Assessment:     History of stage I left breast cancer treated with lump/snbx, xrt and tamoxifen    Plan:        She has no clinical evidence of recurrence and is doing very well after surgery and radiation therapy. She is a good cosmetic result we discussed this with change over the next 6 months still.  I told her she was due to get her mammogram again this summer. She is going to do her own self exams. She should get a clinical exam or on every 6 months also. She is seeing Dr. Darnelle Catalan due to the tamoxifen. In order to decrease her visits we did discuss that if her primary care physician Dr. Juliene Pina would be okay with doing a clinical exam on her but that would be fine. If that is adjacent I will see her back as needed. Otherwise I told her I would be happy to see her annually for exam.

## 2011-09-16 NOTE — Patient Instructions (Signed)

## 2011-09-19 ENCOUNTER — Other Ambulatory Visit: Payer: Self-pay | Admitting: Oncology

## 2011-09-19 NOTE — Progress Notes (Signed)
[facility] found patient BRCA 1-2 negative by truncation-mutation analysis.

## 2011-09-21 ENCOUNTER — Other Ambulatory Visit: Payer: BC Managed Care – PPO

## 2011-09-21 ENCOUNTER — Ambulatory Visit (HOSPITAL_BASED_OUTPATIENT_CLINIC_OR_DEPARTMENT_OTHER): Payer: BC Managed Care – PPO | Admitting: Oncology

## 2011-09-21 VITALS — BP 115/81 | HR 69 | Temp 98.4°F | Ht 65.5 in | Wt 122.1 lb

## 2011-09-21 DIAGNOSIS — C50319 Malignant neoplasm of lower-inner quadrant of unspecified female breast: Secondary | ICD-10-CM

## 2011-09-21 LAB — CBC WITH DIFFERENTIAL/PLATELET
BASO%: 0.3 % (ref 0.0–2.0)
EOS%: 1.6 % (ref 0.0–7.0)
HGB: 12.8 g/dL (ref 11.6–15.9)
MCH: 33.7 pg (ref 25.1–34.0)
MCHC: 34.3 g/dL (ref 31.5–36.0)
RBC: 3.8 10*6/uL (ref 3.70–5.45)
RDW: 13.4 % (ref 11.2–14.5)
lymph#: 1.1 10*3/uL (ref 0.9–3.3)

## 2011-09-21 NOTE — Progress Notes (Signed)
ID: KYAH BUESING  DOB: [birth year]  MR#: [id number]  CSN#: [id number]  HPI: [name] is a 51 year old [place] woman referred by Dr. Yolanda Bonine for evaluation and treatment in the setting of newly diagnosed breast cancer. [name] had routine screening mammography at [facility] on August 8. A potential abnormality was noted in the left breast and the patient was brought back the following day for left diagnostic mammography and ultrasonography.  This confirmed a spiculated nodule in the upper medial portion of the left breast,which was difficult however to delineate completely. Ultrasonography showed a 5 mm irregularly marginated nodule in the area in question and this was felt to be suspicious enough to warrant biopsy, which was performed on March 21, 2011.  This showed [phone]) a low grade breast cancer which was e-cadherin positive supporting of ductal phenotype.  The tumor expressed no amplification of HER-2 by CISH with a ratio of 1.00.  Estrogen receptor was 97% positive, progesterone receptor was 97% positive.  The proliferation marker MIB-1 was 15%.   With this information, the patient was referred to Dr. Dwain Sarna and bilateral breast MRIs were obtained March 28, 2011.  This showed in the inner lower left breast, a 1.1-cm area of streaky enhancement compatible with postbiopsy change.  There were no other abnormal areas of enhancement in the left breast,  nothing in the right breast, no abnormal-appearing lymph nodes and the bony structures at upper limit were incidentally noted to be unremarkable.   The patient then proceeded to left lumpectomy and sentinel lymph node sampling on April 19, 2011,  under Dr. Dwain Sarna.  The final pathology from this procedure ([id number]) confirmed an invasive ductal carcinoma, grade 1, measuring 7 mm with negative margins (closest 3 mm) and no evidence of lymphovascular invasion.  The single sentinel lymph node was negative.  HER-2 was repeated and was  again negative with a CISH ratio of 0.90.   Interval History:   The patient returns today for followup of her breast cancer. Since the last visit here, she completed her radiation treatments mid December 2012, and started tamoxifen at the same time. She goes to the gym at least 3 times a week. She is very aware of the tendency to gain weight through the change of life and very much wants to maintain her current weight.  ROS:  She tolerated the radiation well, with some wet desquamation which has resolved, and mild to moderate fatigue. She feels she is "95% back" at this point. She is tolerating the tamoxifen well, with minimal hot flashes, and no significant increase in the vaginal wetness, which has been an issue with her in the past. She has had no menstrual periods since October of 2012. A separate issue is problems with her toes. This predates her start of tamoxifen, and for a long time she thought this was ingrown toenails related to exercise. She finally saw a podiatrist and he feels this is due to Raynaud's syndrome. He started her on nifedipine for that. Otherwise a detailed review of systems was noncontributory.  No Known Allergies  Current Outpatient Prescriptions  Medication Sig Dispense Refill  . aspirin 81 MG tablet Take 81 mg by mouth daily.        . Calcium Carbonate-Vitamin D (CALCIUM 600 + D PO) Take by mouth daily.        Marland Kitchen doxycycline (MONODOX) 100 MG capsule Take 100 mg by mouth 2 (two) times daily.      . Eflornithine HCl (VANIQA) 13.9 % cream  Apply 1 application topically 2 (two) times daily.        . fish oil-omega-3 fatty acids 1000 MG capsule Take 1,200 mg by mouth daily.        . hydroquinone 4 % cream Apply topically daily.        . Multiple Vitamin (MULTIVITAMIN PO) Take by mouth daily.        Marland Kitchen NIFEdipine (PROCARDIA) 10 MG capsule       . tamoxifen (NOLVADEX) 20 MG tablet Take 1 tablet by mouth Daily.      Marland Kitchen tretinoin (RETIN-A) 0.05 % cream Apply 1 application  topically at bedtime.          PAST MEDICAL HISTORY:  Significant for remote right hip fracture with a metal plate in place, status post wisdom teeth removal, removal history of gingival surgery in the past.  FAMILY HISTORY:  The patient's father is alive at age 67.  The patient's mother is alive also at age 15.  She had breast cancer diagnosed at age 30.  The patient has 1 brother and 2 sisters.  There is no other history of cancer in the immediate family and no history of ovarian cancer.  GYNECOLOGIC HISTORY:  She had menarche at age 78 most recent menstrual period was about 2 weeks ago.  Her periods are regular while she is on birth control pills but not when not and the most recent period for example lasted 6 days of which most were not heavy.  The patient is GX, P2, first pregnancy to term at age 12, so she understands that she has to reasons for increased breast cancer risk, namely her mother's breast cancer and the fact that she did not carry a child to term until she was 54.  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  Her husband Molly Maduro (goes by [employer]) is a our Sports administrator in [place].  They have 2 daughters, one 64 years old, she is a Holiday representative in [employer], [employer];  the other one is a Holiday representative in college at age 34 and she attends [employer]. The patient is Episcopalian.  HEALTH MAINTENANCE:  She has never had a colonoscopy or bone density.  She recently had a Pap smear under Dr. Juliene Pina but does not yet know the results.  There is no history of tobacco abuse.  She drinks perhaps 2 glasses of wine a day and the issue breast cancer risk and alcohol was discussed with her.  It was recommended that if she could moderate drinking to 1 drink a day that might be preferable.  Cholesterol she says has been "normal". This is good  Objective: White woman in early middle age who appears slightly anxious  Filed Vitals:   09/21/11 1544  BP: 115/81  Pulse: 69  Temp: 98.4 F (36.9 C)    BMI: Body mass index is  20.01 kg/(m^2).   ECOG FS: 0  Physical Exam:   Sclerae unicteric  Oropharynx clear  No peripheral adenopathy, and specifically the left axilla is clear  Lungs clear -- no rales or rhonchi  Heart regular rate and rhythm  Abdomen soft, nontender, positive bowel sounds, no masses or organomegaly noted  MSK no focal spinal tenderness, no peripheral edema; she has lesions at the tips of 2 toes on the right foot; these are pink, slightly raised, and irregular. In a different setting they might be suggestive of cholesterol emboli. A localized fungal infection is also possible.  Neuro nonfocal  Breast exam: Right breast,  no suspicious findings; left breast, status post lumpectomy and radiation. There is minimal hyperpigmentation persisting; no evidence of local recurrence  Lab Results:      Chemistry      Component Value Date/Time   NA 137 05/12/2011 1609   K 4.0 05/12/2011 1609   CL 103 05/12/2011 1609   CO2 27 05/12/2011 1609   BUN 15 05/12/2011 1609   CREATININE 0.69 05/12/2011 1609      Component Value Date/Time   CALCIUM 9.1 05/12/2011 1609   ALKPHOS 46 05/12/2011 1609   AST 29 05/12/2011 1609   ALT 24 05/12/2011 1609   BILITOT 0.3 05/12/2011 1609       Lab Results  Component Value Date   WBC 7.2 09/21/2011   HGB 12.8 09/21/2011   HCT 37.4 09/21/2011   MCV 98.4 09/21/2011   PLT 215 09/21/2011   NEUTROABS 5.6 09/21/2011    Studies/Results:  No new results found.  Assessment: 51 year old [place] woman status post left lumpectomy and sentinel lymph node sampling September 2012 for a T1b N0, Stage IA invasive ductal carcinoma, grade 1, which was strongly estrogen and progesterone receptor positive, HER-2 negative  with an MIB-1 of 15%.   She completed radiation in December 2012 and started tamoxifen at that time.  Plan: She is tolerating the tamoxifen without unusual toxicities. The plan is to continue tamoxifen for 2-5 years with consideration of switching to an aromatase inhibitor  after 2 years.  I am not sure what the lesions on her toes are. I don't think they are ingrown toenails, and while they could be related to Raynaud's phenomenon, it would seem curious that she does not have similar signs over her hands. She gives me no systemic symptoms whatsoever so I doubt vasculitis or coagulopathy (recall the problem predated the start of tamoxifen). Perhaps a low-grade fungal infection or localized eczematous process is the culprit here, and I have suggested she bring the problem to the attention of her dermatologist, Para Skeans, for a second opinion.   Otherwise she will followup here in 6 months. She knows to call for any problems that may develop before that visit.     Yordi Krager C 09/21/2011

## 2011-10-04 ENCOUNTER — Other Ambulatory Visit: Payer: Self-pay | Admitting: Dermatology

## 2012-01-05 ENCOUNTER — Telehealth: Payer: Self-pay | Admitting: Oncology

## 2012-01-05 NOTE — Telephone Encounter (Signed)
S/w the pt and she is aware of the r/s md appt from aug 15th to 03/27/2012 due to the md's schedule

## 2012-03-15 ENCOUNTER — Other Ambulatory Visit (HOSPITAL_BASED_OUTPATIENT_CLINIC_OR_DEPARTMENT_OTHER): Payer: BC Managed Care – PPO | Admitting: Lab

## 2012-03-15 DIAGNOSIS — C50319 Malignant neoplasm of lower-inner quadrant of unspecified female breast: Secondary | ICD-10-CM

## 2012-03-15 LAB — COMPREHENSIVE METABOLIC PANEL
Albumin: 4 g/dL (ref 3.5–5.2)
CO2: 28 mEq/L (ref 19–32)
Chloride: 100 mEq/L (ref 96–112)
Glucose, Bld: 93 mg/dL (ref 70–99)
Potassium: 3.8 mEq/L (ref 3.5–5.3)
Sodium: 137 mEq/L (ref 135–145)
Total Protein: 6.9 g/dL (ref 6.0–8.3)

## 2012-03-15 LAB — CBC WITH DIFFERENTIAL/PLATELET
Eosinophils Absolute: 0.1 10*3/uL (ref 0.0–0.5)
MONO#: 0.3 10*3/uL (ref 0.1–0.9)
NEUT#: 3.7 10*3/uL (ref 1.5–6.5)
Platelets: 193 10*3/uL (ref 145–400)
RBC: 3.78 10*6/uL (ref 3.70–5.45)
RDW: 13 % (ref 11.2–14.5)
WBC: 5.4 10*3/uL (ref 3.9–10.3)

## 2012-03-20 ENCOUNTER — Ambulatory Visit: Payer: BC Managed Care – PPO | Admitting: Oncology

## 2012-03-20 LAB — CRYOGLOBULIN

## 2012-03-22 ENCOUNTER — Ambulatory Visit: Payer: BC Managed Care – PPO | Admitting: Oncology

## 2012-03-27 ENCOUNTER — Telehealth: Payer: Self-pay | Admitting: *Deleted

## 2012-03-27 ENCOUNTER — Ambulatory Visit (HOSPITAL_BASED_OUTPATIENT_CLINIC_OR_DEPARTMENT_OTHER): Payer: BC Managed Care – PPO | Admitting: Oncology

## 2012-03-27 VITALS — BP 134/80 | HR 61 | Temp 98.2°F | Resp 20 | Ht 65.5 in | Wt 122.3 lb

## 2012-03-27 DIAGNOSIS — C50319 Malignant neoplasm of lower-inner quadrant of unspecified female breast: Secondary | ICD-10-CM

## 2012-03-27 DIAGNOSIS — Z170 Estrogen receptor positive status [ER+]: Secondary | ICD-10-CM

## 2012-03-27 NOTE — Progress Notes (Signed)
ID: ALIA PARSLEY  DOB: [birth year]  MR#: [id number]  CSN#: [id number]  HPI: Janett Kamath is a 51 year old [place] woman referred by Dr. Yolanda Bonine for evaluation and treatment in the setting of newly diagnosed breast cancer. Ms. Herzberg had routine screening mammography at [facility] on August 8. A potential abnormality was noted in the left breast and the patient was brought back the following day for left diagnostic mammography and ultrasonography.  This confirmed a spiculated nodule in the upper medial portion of the left breast,which was difficult however to delineate completely. Ultrasonography showed a 5 mm irregularly marginated nodule in the area in question and this was felt to be suspicious enough to warrant biopsy, which was performed on March 21, 2011.  This showed [phone]) a low grade breast cancer which was e-cadherin positive supporting of ductal phenotype.  The tumor expressed no amplification of HER-2 by CISH with a ratio of 1.00.  Estrogen receptor was 97% positive, progesterone receptor was 97% positive.  The proliferation marker MIB-1 was 15%.   With this information, the patient was referred to Dr. Dwain Sarna and bilateral breast MRIs were obtained March 28, 2011.  This showed in the inner lower left breast, a 1.1-cm area of streaky enhancement compatible with postbiopsy change.  There were no other abnormal areas of enhancement in the left breast,  nothing in the right breast, no abnormal-appearing lymph nodes and the bony structures at upper limit were incidentally noted to be unremarkable.   The patient then proceeded to left lumpectomy and sentinel lymph node sampling on April 19, 2011,  under Dr. Dwain Sarna.  The final pathology from this procedure ([id number]) confirmed an invasive ductal carcinoma, grade 1, measuring 7 mm with negative margins (closest 3 mm) and no evidence of lymphovascular invasion.  The single sentinel lymph node was negative.  HER-2 was repeated and was  again negative with a CISH ratio of 0.90.   Interval History:   The patient returns today for followup of her breast cancer. Since the last visit here, she saw her dermatologist in her toe problem was diagnosed as pernio. It is already better (it gets better depending on season). 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 She has not had a period since December. She may have had slight spotting in April. She doesn't have any hot flashes. The biggest problem she is having is sleep disturbance. She tends to take Benadryl perhaps 3 or 4 times a week and that helps a little. She has both problems falling asleep and staying asleep. There is a little bit of nocturia but that's not a major component. When she wakes up in the middle of the night her brain tends to "run on". She exercises regularly, and that does help. She has a little bit of joint pain here in there she feels do to exercise, but otherwise a detailed review of systems today was entirely negative.  No Known Allergies  Current Outpatient Prescriptions  Medication Sig Dispense Refill  . aspirin 81 MG tablet Take 81 mg by mouth daily.        . Calcium Carbonate-Vitamin D (CALCIUM 600 + D PO) Take by mouth daily.        Marland Kitchen doxycycline (MONODOX) 100 MG capsule Take 100 mg by mouth 2 (two) times daily.      . Eflornithine HCl (  VANIQA) 13.9 % cream Apply 1 application topically 2 (two) times daily.        . fish oil-omega-3 fatty acids 1000 MG capsule Take 1,200 mg by mouth daily.        . hydroquinone 4 % cream Apply topically daily.        . Multiple Vitamin (MULTIVITAMIN PO) Take by mouth daily.        Marland Kitchen NIFEdipine (PROCARDIA) 10 MG capsule       . tamoxifen (NOLVADEX) 20 MG tablet Take 1 tablet by mouth Daily.      Marland Kitchen tretinoin (RETIN-A) 0.05 % cream Apply 1 application topically at bedtime.           PAST MEDICAL HISTORY:  Significant for remote right hip fracture with a metal plate in place, status post wisdom teeth removal, removal history of gingival surgery in the past.  FAMILY HISTORY:  The patient's father is alive at age 8.  The patient's mother is alive also at age 55.  She had breast cancer diagnosed at age 38.  The patient has 1 brother and 2 sisters.  There is no other history of cancer in the immediate family and no history of ovarian cancer.  GYNECOLOGIC HISTORY:  She had menarche at age 5 most recent menstrual period was about 2 weeks ago.  Her periods are regular while she is on birth control pills but not when not and the most recent period for example lasted 6 days of which most were not heavy.  The patient is GX, P2, first pregnancy to term at age 78, so she understands that she has to reasons for increased breast cancer risk, namely her mother's breast cancer and the fact that she did not carry a child to term until she was 75.  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  Her husband Molly Maduro (goes by [employer]) is a our Sports administrator in [place].  They have 2 daughters, one 87 years old, she is a Holiday representative in high school;  the other one is a Holiday representative in college at age 82 and she attends [employer]. The patient is Episcopalian.  HEALTH MAINTENANCE:  She has never had a colonoscopy or bone density.  She recently had a Pap smear under Dr. Juliene Pina but does not yet know the results.  There is no history of tobacco abuse.  She drinks perhaps 2 glasses of wine a day and the issue breast cancer risk and alcohol was discussed with her.  It was recommended that if she could moderate drinking to 1 drink a day that might be preferable.  Cholesterol she says has been "normal". This is good  Objective: Middle-aged white woman who appears well  Filed Vitals:   03/27/12 0906  BP: 134/80  Pulse: 61  Temp: 98.2 F (36.8 C)  Resp: 20    BMI: Body mass index is 20.04 kg/(m^2).   ECOG FS:  0  Physical Exam:   Sclerae unicteric  Oropharynx clear  No peripheral adenopathy, and specifically the left axilla is clear  Lungs clear -- no rales or rhonchi  Heart regular rate and rhythm  Abdomen soft, nontender, positive bowel sounds, no masses or organomegaly noted  MSK no focal spinal tenderness, no peripheral edema;   Neuro nonfocal  Breast exam: Right breast, no suspicious findings; left breast, status post lumpectomy and radiation. No evidence of local recurrence  Lab Results:      Chemistry      Component Value Date/Time  NA 137 03/15/2012 1522   K 3.8 03/15/2012 1522   CL 100 03/15/2012 1522   CO2 28 03/15/2012 1522   BUN 16 03/15/2012 1522   CREATININE 0.84 03/15/2012 1522      Component Value Date/Time   CALCIUM 9.1 03/15/2012 1522   ALKPHOS 43 03/15/2012 1522   AST 27 03/15/2012 1522   ALT 12 03/15/2012 1522   BILITOT 0.4 03/15/2012 1522       Lab Results  Component Value Date   WBC 5.4 03/15/2012   HGB 12.8 03/15/2012   HCT 37.8 03/15/2012   MCV 100.2 03/15/2012   PLT 193 03/15/2012   NEUTROABS 3.7 03/15/2012    Studies/Results:  No new results found. Repeat mammography is due next month  Assessment: 51 year old [place] woman status post left lumpectomy and sentinel lymph node sampling September 2012 for a T1b N0, Stage IA invasive ductal carcinoma, grade 1, which was strongly estrogen and progesterone receptor positive, HER-2 negative  with an MIB-1 of 15%.   She completed radiation in December 2012 and started tamoxifen at that time.  Plan: The plan is to continue tamoxifen for at least another year and then discuss of whether we want to switch to an aromatase inhibitor. We discussed the new tomo-technique in mammography and I recommended she participate in it. We also talked about postmenopausal issues including sleep disturbance. It's fine for her to take Benadryl every night. If she finds she cannot quite "turn off the engine" in  the middle of the night of she could  benefit from an antianxiety agent and we discussed that. I also suggested perhaps cutting back slightly on the evening wine which can also interrupt sleep. In general however Olegario Messier is doing fantastic. I am comfortable seeing her on a once a year basis, namely in September after her mammography. It would be ideal if she does see Dr. Juliene Pina sometime in February or March, and with those 2 visits during the year I think she would have adequate medical followup for her breast cancer.     Mahamadou Weltz C 03/27/2012

## 2012-03-27 NOTE — Telephone Encounter (Signed)
Gave patient appointment for 04-2013 with the lab one week before the md appointment

## 2012-05-12 IMAGING — CR DG CHEST 2V
2 series · 2 of 2 positions shown · non-contrast
Comparison: None.

CLINICAL DATA: Preop, history of left breast carcinoma

CHEST - 2 VIEW

[view not recorded (1 of 2)]
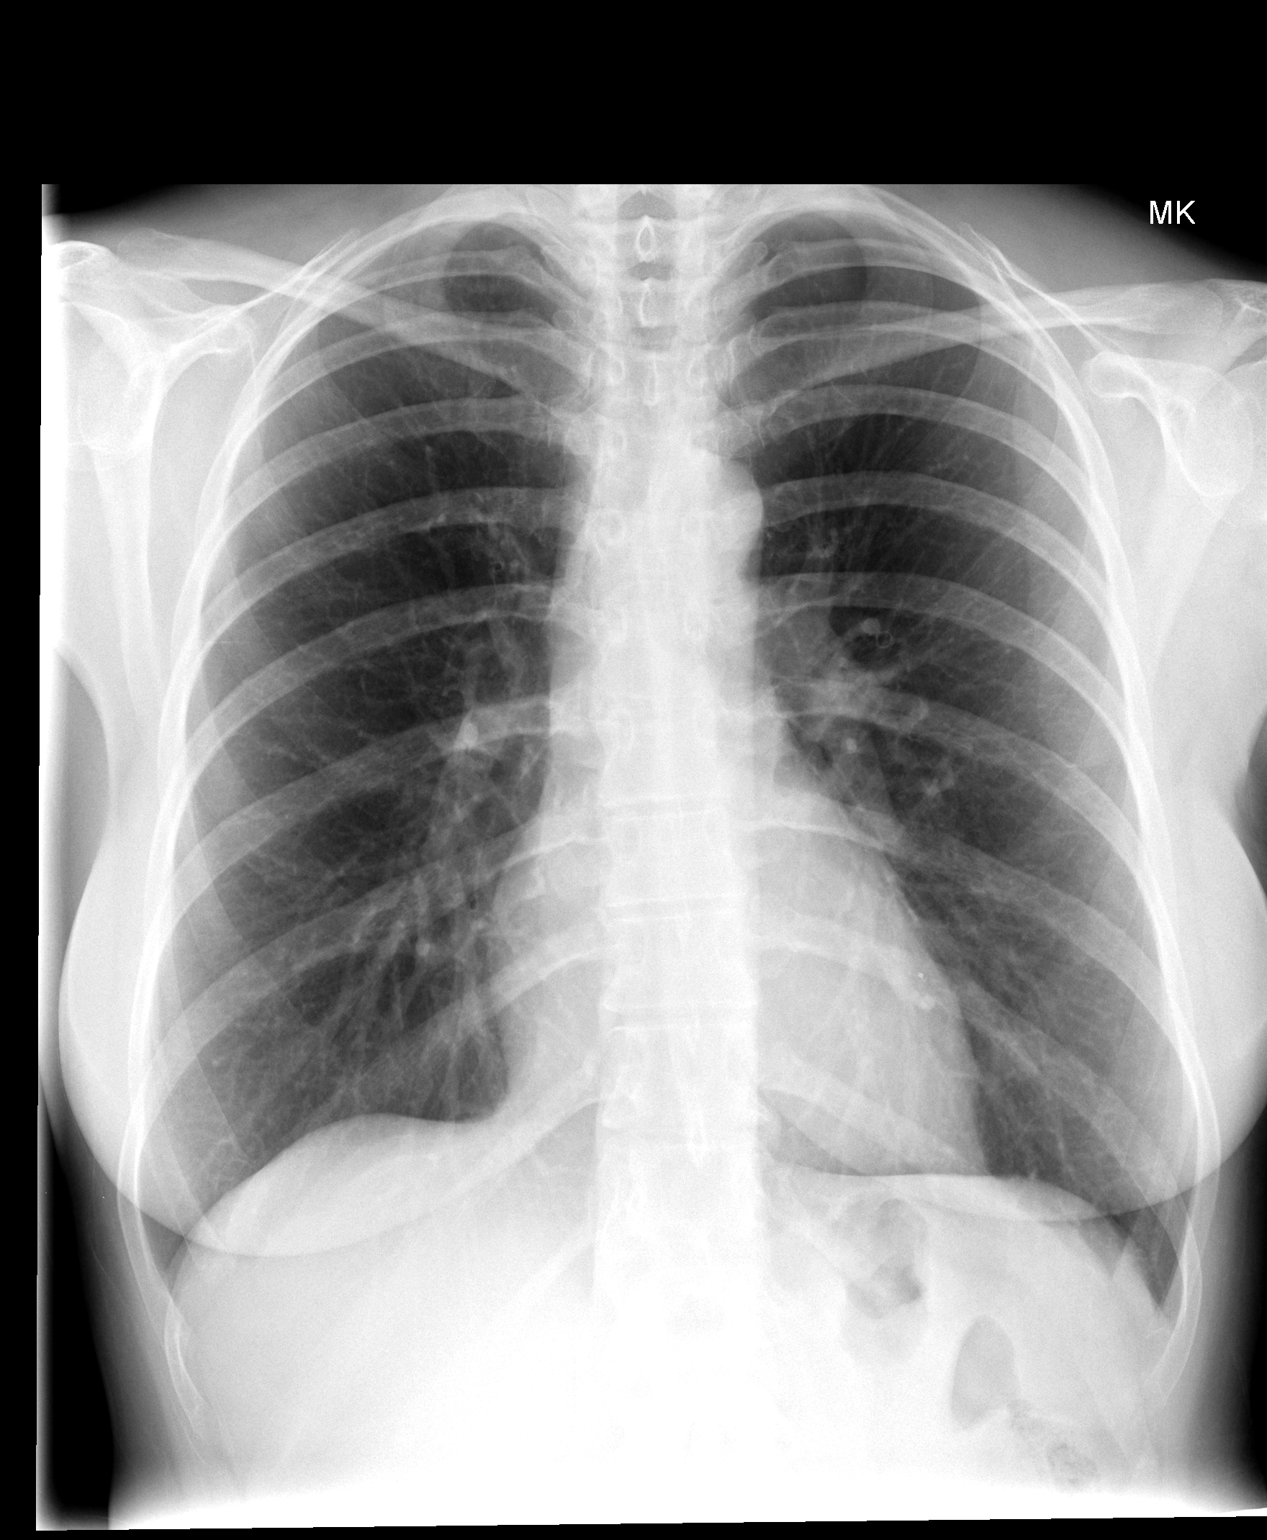

[view not recorded (2 of 2)]
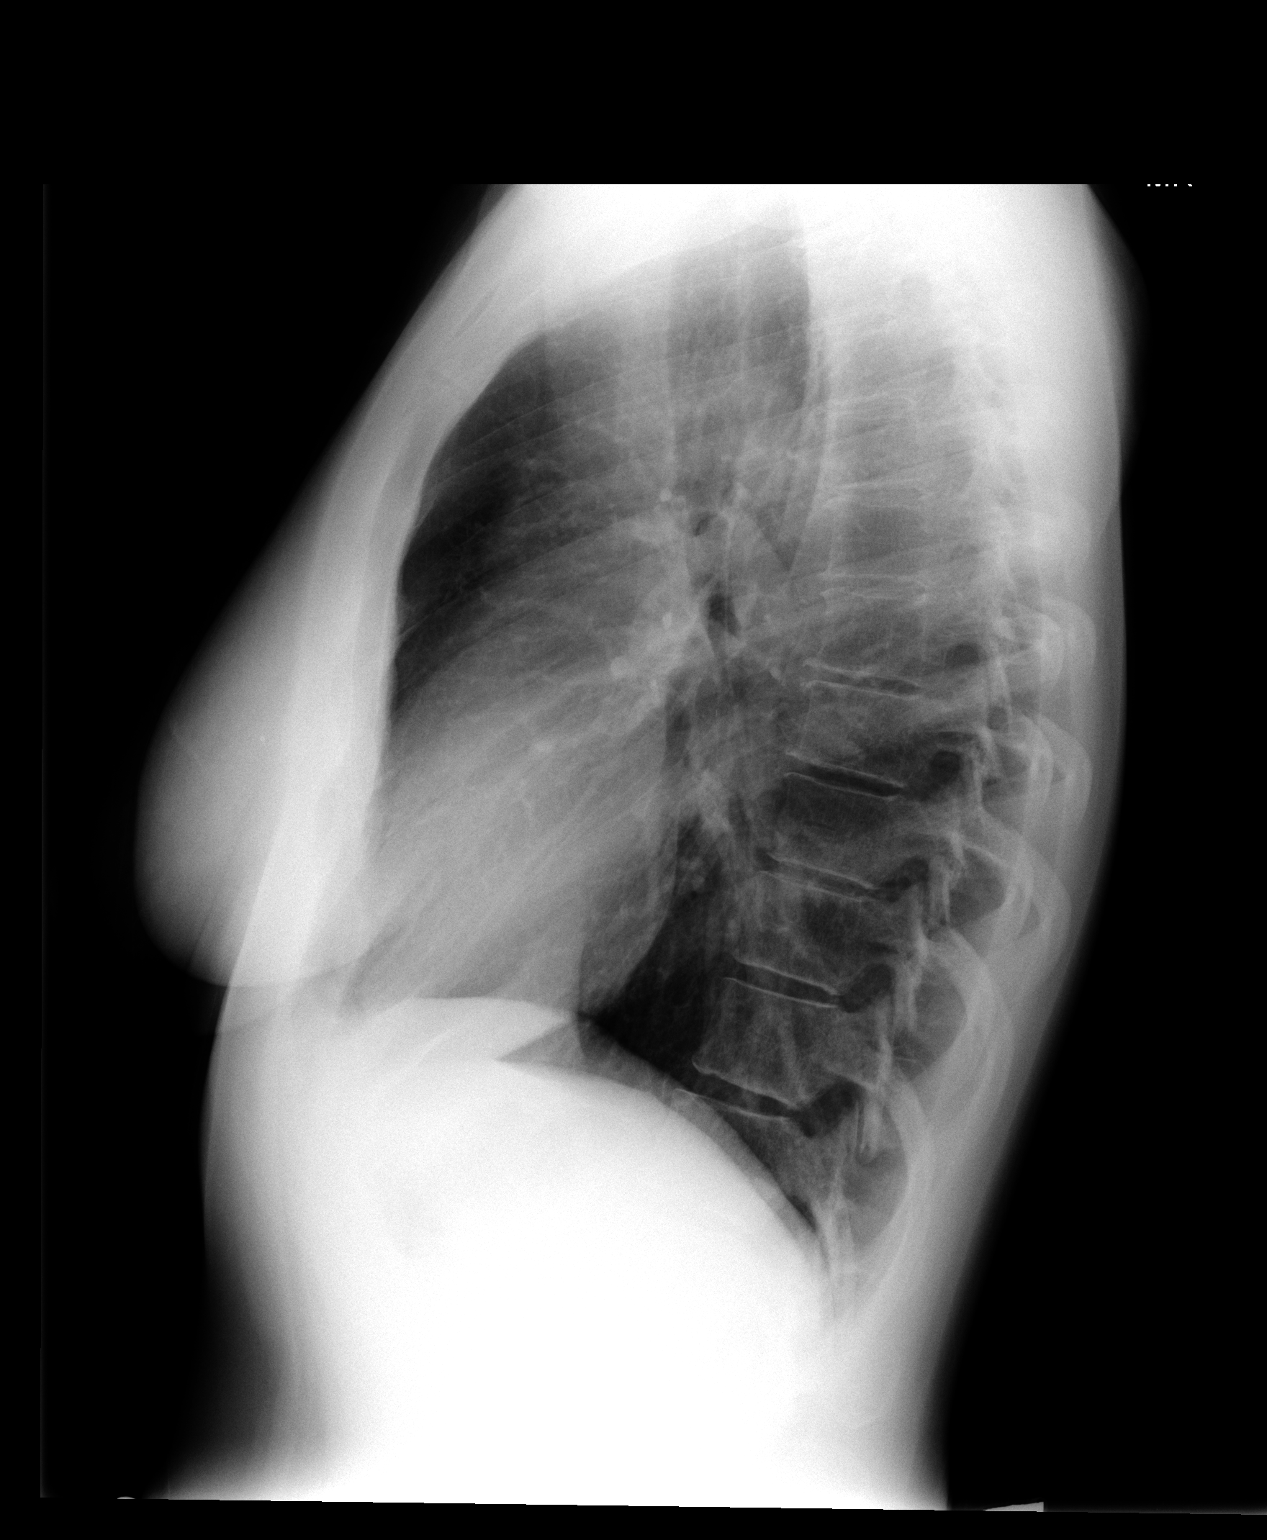

[2 of 2 positions shown; findings below may reference images not displayed]

FINDINGS: The lungs are clear.  Mediastinal contours appear normal.
The heart is within normal limits in size.  No bony abnormality is
seen.
IMPRESSION: No active lung disease.

## 2012-07-04 ENCOUNTER — Telehealth: Payer: Self-pay | Admitting: Oncology

## 2012-07-04 NOTE — Telephone Encounter (Signed)
S/w the pt and she is aware of the cancelled 04/25/2013 appt that has been r/s to 05/02/2013 due to the md will be on vac. °

## 2012-07-11 ENCOUNTER — Other Ambulatory Visit: Payer: Self-pay | Admitting: Oncology

## 2012-07-11 DIAGNOSIS — C50319 Malignant neoplasm of lower-inner quadrant of unspecified female breast: Secondary | ICD-10-CM

## 2013-04-16 ENCOUNTER — Telehealth: Payer: Self-pay | Admitting: *Deleted

## 2013-04-16 NOTE — Telephone Encounter (Signed)
Pt called to move her lab appt from 11am to 1:30pm on 04/18/13....td

## 2013-04-17 ENCOUNTER — Other Ambulatory Visit: Payer: Self-pay | Admitting: Physician Assistant

## 2013-04-17 DIAGNOSIS — C50319 Malignant neoplasm of lower-inner quadrant of unspecified female breast: Secondary | ICD-10-CM

## 2013-04-18 ENCOUNTER — Other Ambulatory Visit (HOSPITAL_BASED_OUTPATIENT_CLINIC_OR_DEPARTMENT_OTHER): Payer: BC Managed Care – PPO | Admitting: Lab

## 2013-04-18 ENCOUNTER — Other Ambulatory Visit: Payer: BC Managed Care – PPO | Admitting: Lab

## 2013-04-18 DIAGNOSIS — C50319 Malignant neoplasm of lower-inner quadrant of unspecified female breast: Secondary | ICD-10-CM

## 2013-04-18 LAB — CBC WITH DIFFERENTIAL/PLATELET
BASO%: 0.2 % (ref 0.0–2.0)
Eosinophils Absolute: 0.1 10*3/uL (ref 0.0–0.5)
LYMPH%: 26.4 % (ref 14.0–49.7)
MCHC: 32.9 g/dL (ref 31.5–36.0)
MONO#: 0.3 10*3/uL (ref 0.1–0.9)
MONO%: 4.7 % (ref 0.0–14.0)
NEUT#: 4.2 10*3/uL (ref 1.5–6.5)
Platelets: 218 10*3/uL (ref 145–400)
RBC: 3.94 10*6/uL (ref 3.70–5.45)
RDW: 13 % (ref 11.2–14.5)
WBC: 6.1 10*3/uL (ref 3.9–10.3)

## 2013-04-18 LAB — COMPREHENSIVE METABOLIC PANEL (CC13)
ALT: 53 U/L (ref 0–55)
Albumin: 3.6 g/dL (ref 3.5–5.0)
Alkaline Phosphatase: 42 U/L (ref 40–150)
Potassium: 4.5 mEq/L (ref 3.5–5.1)
Sodium: 142 mEq/L (ref 136–145)
Total Bilirubin: 0.51 mg/dL (ref 0.20–1.20)
Total Protein: 6.9 g/dL (ref 6.4–8.3)

## 2013-04-25 ENCOUNTER — Ambulatory Visit: Payer: BC Managed Care – PPO | Admitting: Oncology

## 2013-05-02 ENCOUNTER — Ambulatory Visit (HOSPITAL_BASED_OUTPATIENT_CLINIC_OR_DEPARTMENT_OTHER): Payer: BC Managed Care – PPO | Admitting: Oncology

## 2013-05-02 ENCOUNTER — Telehealth: Payer: Self-pay | Admitting: *Deleted

## 2013-05-02 VITALS — BP 121/86 | HR 67 | Temp 98.4°F | Resp 18 | Ht 65.5 in | Wt 122.7 lb

## 2013-05-02 DIAGNOSIS — C50319 Malignant neoplasm of lower-inner quadrant of unspecified female breast: Secondary | ICD-10-CM

## 2013-05-02 DIAGNOSIS — C50312 Malignant neoplasm of lower-inner quadrant of left female breast: Secondary | ICD-10-CM | POA: Insufficient documentation

## 2013-05-02 MED ORDER — ANASTROZOLE 1 MG PO TABS: 1.0000 mg | ORAL_TABLET | Freq: Every day | ORAL | Status: DC

## 2013-05-02 NOTE — Addendum Note (Signed)
Addended by: Billey Co on: 05/02/2013 05:21 PM   Modules accepted: Orders, Medications

## 2013-05-02 NOTE — Progress Notes (Signed)
ID: [name]  DOB: Feb 08, 1961  MR#: [id number]  CSN#: [id number]  PCP: Julia Fries, MD GYN: SU:  OTHER MD:   HPI: [name] is a 52 year old [place] woman referred by Dr. Yolanda Bowers for evaluation and treatment in the setting of newly diagnosed breast cancer. [name] had routine screening mammography at [facility] on August 8. A potential abnormality was noted in the left breast and the patient was brought back the following day for left diagnostic mammography and ultrasonography.  This confirmed a spiculated nodule in the upper medial portion of the left breast,which was difficult however to delineate completely. Ultrasonography showed a 5 mm irregularly marginated nodule in the area in question and this was felt to be suspicious enough to warrant biopsy, which was performed on March 21, 2011.  This showed [phone] a low grade breast cancer which was e-cadherin positive supporting of ductal phenotype.  The tumor expressed no amplification of HER-2 by CISH with a ratio of 1.00.  Estrogen receptor was 97% positive, progesterone receptor was 97% positive.  The proliferation marker MIB-1 was 15%.   With this information, the patient was referred to Dr. Dwain Bowers and bilateral breast MRIs were obtained March 28, 2011.  This showed in the inner lower left breast, a 1.1-cm area of streaky enhancement compatible with postbiopsy change.  There were no other abnormal areas of enhancement in the left breast,  nothing in the right breast, no abnormal-appearing lymph nodes and the bony structures at upper limit were incidentally noted to be unremarkable.   The patient then proceeded to left lumpectomy and sentinel lymph node sampling on April 19, 2011,  under Dr. Dwain Bowers.  The final pathology from this procedure ([id number]) confirmed an invasive ductal carcinoma, grade 1, measuring 7 mm with negative margins (closest 3 mm) and no evidence of lymphovascular invasion.  The single sentinel  lymph node was negative.  HER-2 was repeated and was again negative with a CISH ratio of 0.90.   Interval History:   [name] returns today for followup of her breast cancer. The interval history is significant for her having some breakthrough vaginal bleeding, evaluated by Dr. Mitzi Bowers as separately scanned. This is going to be related to tamoxifen and the timing is very good because we are just ready to switch to an aromatase inhibitor.  ROS: Aside from the bleeding problem, she has mild stress urinary incontinence and mild of vaginal dryness symptoms. She has not had problems with hot flashes from the tamoxifen. She is going to the gym 5 days a week and struggling to gain weight. She is having 2 glasses of wine every evening and of course this is something she has to "burned the next day" at the gym. It is also affecting her liver function tests as noted below. A detailed review of systems was otherwise entirely negative  No Known Allergies  Current Outpatient Prescriptions  Medication Sig Dispense Refill  . aspirin 81 MG tablet Take 81 mg by mouth daily.        . Calcium Carbonate-Vitamin D (CALCIUM 600 + D PO) Take by mouth daily.        Marland Kitchen doxycycline (MONODOX) 100 MG capsule Take 100 mg by mouth 2 (two) times daily.      . Eflornithine HCl (VANIQA) 13.9 % cream Apply 1 application topically 2 (two) times daily.        . fish oil-omega-3 fatty acids 1000 MG capsule Take 1,200 mg by mouth daily.        Marland Kitchen  hydroquinone 4 % cream Apply topically daily.        . Multiple Vitamin (MULTIVITAMIN PO) Take by mouth daily.        Marland Kitchen NIFEdipine (PROCARDIA) 10 MG capsule       . tamoxifen (NOLVADEX) 20 MG tablet TAKE 1 TABLET BY MOUTH ONCE A DAY  90 tablet  3  . tretinoin (RETIN-A) 0.05 % cream Apply 1 application topically at bedtime.         No current facility-administered medications for this visit.    PAST MEDICAL HISTORY:  Significant for remote right hip fracture with a metal plate in place, status  post wisdom teeth removal, removal history of gingival surgery in the past.  FAMILY HISTORY:  The patient's father is alive at age 61.  The patient's mother is alive also at age 50.  She had breast cancer diagnosed at age 65.  The patient has 1 brother and 2 sisters.  There is no other history of cancer in the immediate family and no history of ovarian cancer.  GYNECOLOGIC HISTORY:  She had menarche at age 63. The patient is GX, P2, first pregnancy to term at age 76, so she understands that she has two reasons for increased breast cancer risk, namely her mother's breast cancer and the fact that she did not carry a child to term until she was 62.  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  Her husband Molly Maduro (goes by [employer]) is a our Sports administrator in [place].  They have 2 daughters, one started college ([employer], nursing) 2014,;  the other one graduated from [place] [employer] 2014 and now lives in [place]. The patient is Episcopalian.  HEALTH MAINTENANCE:  She has never had a colonoscopy or bone density.  Pap smears are up-to-date through Dr. Mitzi Bowers.  There is no history of tobacco abuse.  She drinks 2 glasses of wine a day and the issue breast cancer risk and alcohol has been discussed with her.  Cholesterol she says has been "normal".   Objective: Middle-aged white woman in no acute distress  Filed Vitals:   05/02/13 0903  BP: 121/86  Pulse: 67  Temp: 98.4 F (36.9 C)  Resp: 18   Body mass index is 20.1 kg/(m^2).   ECOG FS: 0  Physical Exam:   Sclerae unicteric  Oropharynx clear  No cervical or supraclavicular adenopathy  Lungs clear -- no rales or rhonchi  Heart regular rate and rhythm  Abdomen soft, nontender, positive bowel sounds, no masses or organomegaly noted  MSK no focal spinal tenderness, no peripheral edema;   Neuro nonfocal,  Well oriented, appropriate affect  Breast exam: Right breast, no suspicious findings; left breast, status post lumpectomy and radiation. No evidence of local  recurrence. Both axillae are benign  Lab Results:      Chemistry      Component Value Date/Time   NA 142 04/18/2013 1339   NA 137 03/15/2012 1522   K 4.5 04/18/2013 1339   K 3.8 03/15/2012 1522   CL 100 03/15/2012 1522   CO2 30* 04/18/2013 1339   CO2 28 03/15/2012 1522   BUN 13.7 04/18/2013 1339   BUN 16 03/15/2012 1522   CREATININE 0.9 04/18/2013 1339   CREATININE 0.84 03/15/2012 1522      Component Value Date/Time   CALCIUM 9.3 04/18/2013 1339   CALCIUM 9.1 03/15/2012 1522   ALKPHOS 42 04/18/2013 1339   ALKPHOS 43 03/15/2012 1522   AST 204* 04/18/2013 1339   AST  27 03/15/2012 1522   ALT 53 04/18/2013 1339   ALT 12 03/15/2012 1522   BILITOT 0.51 04/18/2013 1339   BILITOT 0.4 03/15/2012 1522       Lab Results  Component Value Date   WBC 6.1 04/18/2013   HGB 12.9 04/18/2013   HCT 39.2 04/18/2013   MCV 99.5 04/18/2013   PLT 218 04/18/2013   NEUTROABS 4.2 04/18/2013    Studies/Results: Mammography at [facility] 04/18/2013 showed what looked like benign lymph nodes in the right breast upper outer quadrant. These were evaluated with ultrasound and were felt to be benign.  Assessment: 52 y.o.  [place] woman status post left lumpectomy and sentinel lymph node sampling September 2012 for a T1b N0, Stage IA invasive ductal carcinoma, grade 1, which was strongly estrogen and progesterone receptor positive, HER-2 negative  with an MIB-1 of 15%.    (1) She completed radiation in December 2012   (2) started tamoxifen December 2012, stopped October 2014     Plan: We discussed the fact that after completing 2 years of tamoxifen she has the option of switching to an aromatase inhibitor. This has several advantages. It will completely take care of the uterine bleeding problem, since instead of promoting uterine thickness anastrozole promote uterine atrophy. It will also allow her to complete her antiestrogen therapy and 5 years instead of 10.  We then discussed the possible toxicities side effects and complications  of anastrozole. In particular she understands this puts her at risk of worsening osteoporosis, so we do need to obtain a baseline bone density and this has been scheduled for December.  I have asked her to stop tamoxifen now. He takes a couple of months for that to get out of her system. She will accordingly start anastrozole December 1. He takes a couple months to get side effects from anastrozole, if she is going to have any, so she will see me again in February. If she is tolerating anastrozole well, and there is no major concerns regarding bone density, I will start seeing her on an every 12 month basis every September, after her yearly mammography.  She has a very good understanding of your oh plan and is very much in agreement with proceeding.   MAGRINAT,GUSTAV C 05/02/2013

## 2013-05-02 NOTE — Telephone Encounter (Signed)
appts made and printed. gv pt appt for [facility] ...td

## 2013-06-20 ENCOUNTER — Telehealth: Payer: Self-pay | Admitting: *Deleted

## 2013-06-20 DIAGNOSIS — C50919 Malignant neoplasm of unspecified site of unspecified female breast: Secondary | ICD-10-CM

## 2013-06-20 MED ORDER — ANASTROZOLE 1 MG PO TABS: 1.0000 mg | ORAL_TABLET | Freq: Every day | ORAL | Status: DC

## 2013-06-20 NOTE — Telephone Encounter (Signed)
Patient calling to have Anastrozole called to pharmacy per last office visit. Patient to start this on 07/08/2013.

## 2013-06-27 ENCOUNTER — Telehealth: Payer: Self-pay | Admitting: *Deleted

## 2013-09-09 ENCOUNTER — Other Ambulatory Visit: Payer: Self-pay | Admitting: *Deleted

## 2013-09-09 DIAGNOSIS — C50312 Malignant neoplasm of lower-inner quadrant of left female breast: Secondary | ICD-10-CM

## 2013-09-10 ENCOUNTER — Telehealth: Payer: Self-pay | Admitting: *Deleted

## 2013-09-10 ENCOUNTER — Ambulatory Visit (HOSPITAL_BASED_OUTPATIENT_CLINIC_OR_DEPARTMENT_OTHER): Payer: BC Managed Care – PPO | Admitting: Oncology

## 2013-09-10 ENCOUNTER — Encounter (INDEPENDENT_AMBULATORY_CARE_PROVIDER_SITE_OTHER): Payer: Self-pay

## 2013-09-10 ENCOUNTER — Other Ambulatory Visit (HOSPITAL_BASED_OUTPATIENT_CLINIC_OR_DEPARTMENT_OTHER): Payer: BC Managed Care – PPO

## 2013-09-10 VITALS — BP 133/87 | HR 60 | Temp 98.1°F | Resp 18 | Ht 65.5 in | Wt 122.1 lb

## 2013-09-10 DIAGNOSIS — C50319 Malignant neoplasm of lower-inner quadrant of unspecified female breast: Secondary | ICD-10-CM

## 2013-09-10 DIAGNOSIS — C50312 Malignant neoplasm of lower-inner quadrant of left female breast: Secondary | ICD-10-CM

## 2013-09-10 DIAGNOSIS — Z170 Estrogen receptor positive status [ER+]: Secondary | ICD-10-CM

## 2013-09-10 DIAGNOSIS — N951 Menopausal and female climacteric states: Secondary | ICD-10-CM

## 2013-09-10 LAB — COMPREHENSIVE METABOLIC PANEL (CC13)
ALT: 20 U/L (ref 0–55)
AST: 27 U/L (ref 5–34)
Albumin: 4.1 g/dL (ref 3.5–5.0)
Alkaline Phosphatase: 57 U/L (ref 40–150)
Anion Gap: 8 mEq/L (ref 3–11)
BUN: 16.5 mg/dL (ref 7.0–26.0)
CALCIUM: 10 mg/dL (ref 8.4–10.4)
CO2: 29 mEq/L (ref 22–29)
Chloride: 102 mEq/L (ref 98–109)
Creatinine: 0.8 mg/dL (ref 0.6–1.1)
GLUCOSE: 92 mg/dL (ref 70–140)
POTASSIUM: 4.5 meq/L (ref 3.5–5.1)
Sodium: 139 mEq/L (ref 136–145)
Total Bilirubin: 0.42 mg/dL (ref 0.20–1.20)
Total Protein: 7.3 g/dL (ref 6.4–8.3)

## 2013-09-10 LAB — CBC WITH DIFFERENTIAL/PLATELET
BASO%: 0.8 % (ref 0.0–2.0)
BASOS ABS: 0 10*3/uL (ref 0.0–0.1)
EOS ABS: 0.2 10*3/uL (ref 0.0–0.5)
EOS%: 4.6 % (ref 0.0–7.0)
HCT: 41.4 % (ref 34.8–46.6)
HEMOGLOBIN: 14.2 g/dL (ref 11.6–15.9)
LYMPH%: 45.3 % (ref 14.0–49.7)
MCH: 33.5 pg (ref 25.1–34.0)
MCHC: 34.2 g/dL (ref 31.5–36.0)
MCV: 98 fL (ref 79.5–101.0)
MONO#: 0.3 10*3/uL (ref 0.1–0.9)
MONO%: 7.7 % (ref 0.0–14.0)
NEUT%: 41.6 % (ref 38.4–76.8)
NEUTROS ABS: 1.6 10*3/uL (ref 1.5–6.5)
PLATELETS: 222 10*3/uL (ref 145–400)
RBC: 4.22 10*6/uL (ref 3.70–5.45)
RDW: 12.6 % (ref 11.2–14.5)
WBC: 3.9 10*3/uL (ref 3.9–10.3)
lymph#: 1.8 10*3/uL (ref 0.9–3.3)

## 2013-09-10 MED ORDER — VENLAFAXINE HCL ER 37.5 MG PO CP24: 37.5000 mg | ORAL_CAPSULE | Freq: Every day | ORAL | Status: DC

## 2013-09-10 NOTE — Progress Notes (Signed)
ID: Neoma Laming  DOB: 22-Dec-1960  MR#: [id number]  CSN#: [id number]  PCP: Elveria Royals, MD GYN: SU:  OTHER MD:   HPI: Amorita Vanrossum is a 53 year old [place] woman referred by Dr. Isaiah Blakes for evaluation and treatment in the setting of newly diagnosed breast cancer. Ms. Diesing had routine screening mammography at [facility] on August 8. A potential abnormality was noted in the left breast and the patient was brought back the following day for left diagnostic mammography and ultrasonography.  This confirmed a spiculated nodule in the upper medial portion of the left breast,which was difficult however to delineate completely. Ultrasonography showed a 5 mm irregularly marginated nodule in the area in question and this was felt to be suspicious enough to warrant biopsy, which was performed on March 21, 2011.  This showed [phone]) a low grade breast cancer which was e-cadherin positive supporting of ductal phenotype.  The tumor expressed no amplification of HER-2 by CISH with a ratio of 1.00.  Estrogen receptor was 97% positive, progesterone receptor was 97% positive.  The proliferation marker MIB-1 was 15%.   With this information, the patient was referred to Dr. Donne Hazel and bilateral breast MRIs were obtained March 28, 2011.  This showed in the inner lower left breast, a 1.1-cm area of streaky enhancement compatible with postbiopsy change.  There were no other abnormal areas of enhancement in the left breast,  nothing in the right breast, no abnormal-appearing lymph nodes and the bony structures at upper limit were incidentally noted to be unremarkable.   The patient then proceeded to left lumpectomy and sentinel lymph node sampling on April 19, 2011,  under Dr. Donne Hazel.  The final pathology from this procedure ([id number]) confirmed an invasive ductal carcinoma, grade 1, measuring 7 mm with negative margins (closest 3 mm) and no evidence of lymphovascular invasion.  The single sentinel  lymph node was negative.  HER-2 was repeated and was again negative with a CISH ratio of 0.90.   Interval History:   Juliann Pulse returns today for followup of her breast cancer. Since her last visit here she went off the tamoxifen and started anastrozole. Also, her husband has been offered a job in [place] and they are planning to  ROS: After starting anastrozole Juliann Pulse has felt some more aches and pains in various parts of her body. However since they're moving she's been doing a lot of boxing and carrying things and also she goes to the gym 5-6 days a week, so it is difficult to sort all that out. She's not sleeping well, she is moderately fatigued, and she is a little bit depressed. The move of course contributes to that, but so does the fact that her father died last year. She feels a bit anxious as well. She has some hot flashes, worse at night. Otherwise a detailed review of systems today was stable  No Known Allergies  Current Outpatient Prescriptions  Medication Sig Dispense Refill  . anastrozole (ARIMIDEX) 1 MG tablet Take 1 tablet (1 mg total) by mouth daily.  90 tablet  3  . aspirin 81 MG tablet Take 81 mg by mouth daily.        . Calcium Carbonate-Vitamin D (CALCIUM 600 + D PO) Take by mouth daily.        . cholecalciferol (VITAMIN D) 1000 UNITS tablet Take 1,000 Units by mouth daily.      . fish oil-omega-3 fatty acids 1000 MG capsule Take 1,200 mg by mouth daily.        Marland Kitchen  hydroquinone 4 % cream Apply topically daily.        . Multiple Vitamin (MULTIVITAMIN PO) Take by mouth daily.        Marland Kitchen tretinoin (RETIN-A) 0.05 % cream Apply 1 application topically at bedtime.         No current facility-administered medications for this visit.    PAST MEDICAL HISTORY:  Significant for remote right hip fracture with a metal plate in place, status post wisdom teeth removal, removal history of gingival surgery in the past.  FAMILY HISTORY:  The patient's father is alive at age 13.  The patient's  mother is alive also at age 60.  She had breast cancer diagnosed at age 56.  The patient has 1 brother and 2 sisters.  There is no other history of cancer in the immediate family and no history of ovarian cancer.  GYNECOLOGIC HISTORY:  She had menarche at age 67. The patient is GX, P2, first pregnancy to term at age 37, so she understands that she has two reasons for increased breast cancer risk, namely her mother's breast cancer and the fact that she did not carry a child to term until she was 58.  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  Her husband Herbie Baltimore (goes by [employer]) is a our Industrial/product designer in [place].  They have 2 daughters, one started college ([employer], nursing) 2014,;  the other one graduated from [employer] 2014 and now lives in [place]. The patient is Episcopalian.  HEALTH MAINTENANCE:  She has never had a colonoscopy or bone density.  Pap smears are up-to-date through Dr. Lisbeth Renshaw.  There is no history of tobacco abuse.  She drinks 2 glasses of wine a day and the issue breast cancer risk and alcohol has been discussed with her.  Cholesterol she says has been "normal".   Objective: Middle-aged white woman in no acute distress  Filed Vitals:   09/10/13 0902  BP: 133/87  Pulse: 60  Temp: 98.1 F (36.7 C)  Resp: 18   Body mass index is 20 kg/(m^2).   ECOG FS: 1  Physical Exam:   Sclerae unicteric, pupils round and equal  Oropharynx clear  No cervical or supraclavicular adenopathy  Lungs clear -- no rales or rhonchi  Heart regular rate and rhythm  Abdomen soft, nontender, positive bowel sounds, no masses or organomegaly noted  MSK no focal spinal tenderness, no peripheral edema;   Neuro nonfocal,  Well oriented, appropriate affect  Breast exam: Right breast, no suspicious findings; left breast, status post lumpectomy and radiation. No evidence of local recurrence.Left axilla is benign  Lab Results:      Chemistry      Component Value Date/Time   NA 142 04/18/2013 1339   NA 137  03/15/2012 1522   K 4.5 04/18/2013 1339   K 3.8 03/15/2012 1522   CL 100 03/15/2012 1522   CO2 30* 04/18/2013 1339   CO2 28 03/15/2012 1522   BUN 13.7 04/18/2013 1339   BUN 16 03/15/2012 1522   CREATININE 0.9 04/18/2013 1339   CREATININE 0.84 03/15/2012 1522      Component Value Date/Time   CALCIUM 9.3 04/18/2013 1339   CALCIUM 9.1 03/15/2012 1522   ALKPHOS 42 04/18/2013 1339   ALKPHOS 43 03/15/2012 1522   AST 204* 04/18/2013 1339   AST 27 03/15/2012 1522   ALT 53 04/18/2013 1339   ALT 12 03/15/2012 1522   BILITOT 0.51 04/18/2013 1339   BILITOT 0.4 03/15/2012 1522  Lab Results  Component Value Date   WBC 3.9 09/10/2013   HGB 14.2 09/10/2013   HCT 41.4 09/10/2013   MCV 98.0 09/10/2013   PLT 222 09/10/2013   NEUTROABS 1.6 09/10/2013    Studies/Results: Mammography at [facility] 04/18/2013 showed what looked like benign lymph nodes in the right breast upper outer quadrant. These were evaluated with ultrasound and were felt to be benign. Bone density 07/17/2013 at [facility] was normal  Assessment: 53 y.o.  [place] woman status post left lumpectomy and sentinel lymph node sampling September 2012 for a T1b N0, Stage IA invasive ductal carcinoma, grade 1, which was strongly estrogen and progesterone receptor positive, HER-2 negative  with an MIB-1 of 15%.    (1) She completed radiation in December 2012   (2) started tamoxifen December 2012, stopped October 2014   (3) started anastrozole December 2014    Plan:  We went over the fact that Kathy's prognosis is really very good regardless of which choice she makes. I would not be uncomfortable if she took tamoxifen for 5 years and stopped, although if she takes tamoxifen to 10 years she would get that extra 3% risk reduction. The problem with tamoxifen was the vaginal lining issue.  It may be that what she is really experiencing is due to the movement very changed circumstances are right now. It is probably worth at keeping on with the anastrozole for at least another  3 months. However with all that hot flashes and some of the emotional issues I think she would benefit from venlafaxine and I am sending a prescription to her pharmacy for her to start that drug. She will call if she has any side effects from it. She understands that if she does go on venlafaxine and wants to come off she will have to do so gradually.  I made her a return appointment here in 3 months. If she has a ready moved in cannot come she will call and we will discuss some of these issues by phone. She will need a total of 5 years of antiestrogen at a minimum. She will call with any problems that may develop before that visit.  Burnice Oestreicher C 09/10/2013

## 2013-09-10 NOTE — Telephone Encounter (Signed)
appts made and printed...td 

## 2013-11-29 NOTE — Telephone Encounter (Signed)
Please see Visit Info comments 

## 2013-12-09 ENCOUNTER — Telehealth: Payer: Self-pay | Admitting: Oncology

## 2013-12-09 NOTE — Telephone Encounter (Signed)
per GM to move appt/cld and left message for pt/adv i would mail out sch

## 2013-12-24 ENCOUNTER — Other Ambulatory Visit: Payer: BC Managed Care – PPO

## 2013-12-24 ENCOUNTER — Encounter: Payer: BC Managed Care – PPO | Admitting: Oncology

## 2014-01-29 ENCOUNTER — Telehealth: Payer: Self-pay | Admitting: Oncology

## 2014-01-29 NOTE — Telephone Encounter (Signed)
pt cld to state she has moved to [place] and needs to see GM while in town-sch am per pt req to see him. R/S

## 2014-03-25 ENCOUNTER — Other Ambulatory Visit: Payer: BC Managed Care – PPO

## 2014-03-25 ENCOUNTER — Ambulatory Visit: Payer: BC Managed Care – PPO | Admitting: Oncology

## 2014-03-28 ENCOUNTER — Other Ambulatory Visit: Payer: Self-pay | Admitting: *Deleted

## 2014-03-28 DIAGNOSIS — C50312 Malignant neoplasm of lower-inner quadrant of left female breast: Secondary | ICD-10-CM

## 2014-03-31 ENCOUNTER — Ambulatory Visit (HOSPITAL_BASED_OUTPATIENT_CLINIC_OR_DEPARTMENT_OTHER): Payer: BC Managed Care – PPO | Admitting: Oncology

## 2014-03-31 ENCOUNTER — Other Ambulatory Visit (HOSPITAL_BASED_OUTPATIENT_CLINIC_OR_DEPARTMENT_OTHER): Payer: BC Managed Care – PPO

## 2014-03-31 VITALS — BP 117/84 | HR 60 | Temp 98.2°F | Resp 18 | Ht 65.5 in | Wt 121.4 lb

## 2014-03-31 DIAGNOSIS — C50919 Malignant neoplasm of unspecified site of unspecified female breast: Secondary | ICD-10-CM

## 2014-03-31 DIAGNOSIS — C50312 Malignant neoplasm of lower-inner quadrant of left female breast: Secondary | ICD-10-CM

## 2014-03-31 DIAGNOSIS — C50319 Malignant neoplasm of lower-inner quadrant of unspecified female breast: Secondary | ICD-10-CM

## 2014-03-31 DIAGNOSIS — Z170 Estrogen receptor positive status [ER+]: Secondary | ICD-10-CM

## 2014-03-31 LAB — COMPREHENSIVE METABOLIC PANEL (CC13)
ALBUMIN: 3.9 g/dL (ref 3.5–5.0)
ALT: 17 U/L (ref 0–55)
ANION GAP: 8 meq/L (ref 3–11)
AST: 29 U/L (ref 5–34)
Alkaline Phosphatase: 49 U/L (ref 40–150)
BILIRUBIN TOTAL: 0.59 mg/dL (ref 0.20–1.20)
BUN: 19.3 mg/dL (ref 7.0–26.0)
CHLORIDE: 107 meq/L (ref 98–109)
CO2: 27 meq/L (ref 22–29)
Calcium: 9.3 mg/dL (ref 8.4–10.4)
Creatinine: 0.9 mg/dL (ref 0.6–1.1)
Glucose: 87 mg/dl (ref 70–140)
POTASSIUM: 4.1 meq/L (ref 3.5–5.1)
SODIUM: 142 meq/L (ref 136–145)
Total Protein: 7 g/dL (ref 6.4–8.3)

## 2014-03-31 LAB — CBC WITH DIFFERENTIAL/PLATELET
BASO%: 0.7 % (ref 0.0–2.0)
Basophils Absolute: 0 10*3/uL (ref 0.0–0.1)
EOS ABS: 0.1 10*3/uL (ref 0.0–0.5)
EOS%: 2.8 % (ref 0.0–7.0)
HCT: 42.7 % (ref 34.8–46.6)
HGB: 14.1 g/dL (ref 11.6–15.9)
LYMPH#: 1.5 10*3/uL (ref 0.9–3.3)
LYMPH%: 41 % (ref 14.0–49.7)
MCH: 33.1 pg (ref 25.1–34.0)
MCHC: 33.1 g/dL (ref 31.5–36.0)
MCV: 99.7 fL (ref 79.5–101.0)
MONO#: 0.3 10*3/uL (ref 0.1–0.9)
MONO%: 8.3 % (ref 0.0–14.0)
NEUT%: 47.2 % (ref 38.4–76.8)
NEUTROS ABS: 1.7 10*3/uL (ref 1.5–6.5)
Platelets: 232 10*3/uL (ref 145–400)
RBC: 4.28 10*6/uL (ref 3.70–5.45)
RDW: 14.3 % (ref 11.2–14.5)
WBC: 3.7 10*3/uL — AB (ref 3.9–10.3)

## 2014-03-31 MED ORDER — VENLAFAXINE HCL ER 75 MG PO CP24: 75.0000 mg | ORAL_CAPSULE | Freq: Every day | ORAL | Status: DC

## 2014-03-31 MED ORDER — ANASTROZOLE 1 MG PO TABS
1.0000 mg | ORAL_TABLET | Freq: Every day | ORAL | Status: AC
Start: 2014-03-31 — End: ?

## 2014-03-31 NOTE — Progress Notes (Signed)
ID: Neoma Laming  DOB: 10-Dec-1960  MR#: [id number]  CSN#: [id number]  PCP: Julia Royals, MD GYN: SU:  OTHER MD:   HPI:   From the prior intake note:   [name] is a 53 year old [place] woman referred by Dr. Isaiah Bowers for evaluation and treatment in the setting of newly diagnosed breast cancer. [name] had routine screening mammography at [name] on August 8. A potential abnormality was noted in the left breast and the patient was brought back the following day for left diagnostic mammography and ultrasonography.  This confirmed a spiculated nodule in the upper medial portion of the left breast,which was difficult however to delineate completely. Ultrasonography showed a 5 mm irregularly marginated nodule in the area in question and this was felt to be suspicious enough to warrant biopsy, which was performed on March 21, 2011.  This showed [phone]) a low grade breast cancer which was e-cadherin positive supporting of ductal phenotype.  The tumor expressed no amplification of HER-2 by CISH with a ratio of 1.00.  Estrogen receptor was 97% positive, progesterone receptor was 97% positive.  The proliferation marker MIB-1 was 15%.   With this information, the patient was referred to Dr. Donne Bowers and bilateral breast MRIs were obtained March 28, 2011.  This showed in the inner lower left breast, a 1.1-cm area of streaky enhancement compatible with postbiopsy change.  There were no other abnormal areas of enhancement in the left breast,  nothing in the right breast, no abnormal-appearing lymph nodes and the bony structures at upper limit were incidentally noted to be unremarkable.   The patient then proceeded to left lumpectomy and sentinel lymph node sampling on April 19, 2011,  under Dr. Donne Bowers.  The final pathology from this procedure ([id number]) confirmed an invasive ductal carcinoma, grade 1, measuring 7 mm with negative margins (closest 3 mm) and no evidence of  lymphovascular invasion.  The single sentinel lymph node was negative.  HER-2 was repeated and was again negative with a CISH ratio of 0.90.   Her subsequent history is as detailed below  Interval History:  [name] continues on anastrozole, which she is tolerating well. The aches and pains she was having before are much better. There may have been due to the move problems and stress. She is finally going to [name]. Unfortunately there have not sold their home here yet. She has been going to [name] to visit her mother, and then back to [name] and then taking her daughter to college. Overall this is a very stressful time for her  ROS: She has good insight and understands her moods are up and down all the time. One day she stopped, round 7 miles. Then she eats a whole box of cookies. Then she cries better than she has 2 or 3 glasses of wine. She also feels her husband is very stressed. He is short tempered and going to the gym all the time. Again the good news is that they seemed to be aware of the problem and she in particular has already decided she needs a Social worker and has identified one in [name]. Aside from these issues, she sleeps poorly, has hot flashes, feels anxious and depressed, has occasional slight urinary stress incontinence, but does not complain of vaginal dryness problems and the arthralgias and myalgias have largely subsided. A detailed review of systems today was otherwise stable  No Known Allergies  Current Outpatient Prescriptions  Medication Sig Dispense Refill  . anastrozole (ARIMIDEX) 1 MG tablet Take  1 tablet (1 mg total) by mouth daily.  90 tablet  3  . aspirin 81 MG tablet Take 81 mg by mouth daily.        . Calcium Carbonate-Vitamin D (CALCIUM 600 + D PO) Take by mouth daily.        . cholecalciferol (VITAMIN D) 1000 UNITS tablet Take 1,000 Units by mouth daily.      . fish oil-omega-3 fatty acids 1000 MG capsule Take 1,200 mg by mouth daily.         . hydroquinone 4 % cream Apply topically daily.        . Multiple Vitamin (MULTIVITAMIN PO) Take by mouth daily.        Marland Kitchen tretinoin (RETIN-A) 0.05 % cream Apply 1 application topically at bedtime.        Marland Kitchen venlafaxine XR (EFFEXOR-XR) 37.5 MG 24 hr capsule Take 1 capsule (37.5 mg total) by mouth daily with breakfast.  90 capsule  12   No current facility-administered medications for this visit.    PAST MEDICAL HISTORY:  Significant for remote right hip fracture with a metal plate in place, status post wisdom teeth removal, removal history of gingival surgery in the past.  FAMILY HISTORY:  The patient's father is alive at age 36.  The patient's mother is alive also at age 35.  She had breast cancer diagnosed at age 55.  The patient has 1 brother and 2 sisters.  There is no other history of cancer in the immediate family and no history of ovarian cancer.  GYNECOLOGIC HISTORY:  She had menarche at age 8. The patient is GX, P2, first pregnancy to term at age 31, so she understands that she has two reasons for increased breast cancer risk, namely her mother's breast cancer and the fact that she did not carry a child to term until she was 57.  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  Her husband [name] (goes by [employer]) is a our Industrial/product designer in [place].  They have 2 daughters, one started college ([employer], nursing) 2014,;  the other one graduated from [employer] 2014 and now lives in [place]. The patient is Episcopalian.  HEALTH MAINTENANCE:  She has never had a colonoscopy or bone density.  Pap smears are up-to-date through [name].  There is no history of tobacco abuse.  She drinks 2 glasses of wine a day and the issue breast cancer risk and alcohol has been discussed with her.  Cholesterol she says has been "normal".   Objective: Middle-aged white woman who appears well  Filed Vitals:   03/31/14 0925  BP: 117/84  Pulse: 60  Temp: 98.2 F (36.8 C)  Resp: 18   Body mass index is 19.89  kg/(m^2).   ECOG FS: 0  Physical Exam:   Sclerae unicteric, pupils round and reactive  Oropharynx clear and moist, teeth in good repair  No cervical or supraclavicular adenopathy  Lungs clear -- no rales or rhonchi  Heart regular rate and rhythm  Abdomen soft, nontender, positive bowel sounds  MSK no focal spinal tenderness, no upper extremity lymphedema;   Neuro nonfocal,  well oriented, appropriate affect  Breast exam: Right breast, no suspicious findings; left breast, status post lumpectomy and radiation. No evidence of local recurrence.Left axilla is benign  Lab Results:      Chemistry      Component Value Date/Time   NA 139 09/10/2013 0848   NA 137 03/15/2012 1522   K 4.5 09/10/2013  0848   K 3.8 03/15/2012 1522   CL 100 03/15/2012 1522   CO2 29 09/10/2013 0848   CO2 28 03/15/2012 1522   BUN 16.5 09/10/2013 0848   BUN 16 03/15/2012 1522   CREATININE 0.8 09/10/2013 0848   CREATININE 0.84 03/15/2012 1522      Component Value Date/Time   CALCIUM 10.0 09/10/2013 0848   CALCIUM 9.1 03/15/2012 1522   ALKPHOS 57 09/10/2013 0848   ALKPHOS 43 03/15/2012 1522   AST 27 09/10/2013 0848   AST 27 03/15/2012 1522   ALT 20 09/10/2013 0848   ALT 12 03/15/2012 1522   BILITOT 0.42 09/10/2013 0848   BILITOT 0.4 03/15/2012 1522       Lab Results  Component Value Date   WBC 3.7* 03/31/2014   HGB 14.1 03/31/2014   HCT 42.7 03/31/2014   MCV 99.7 03/31/2014   PLT 232 03/31/2014   NEUTROABS 1.7 03/31/2014    Studies/Results: Mammography at [facility] 04/18/2013 showed what looked like benign lymph nodes in the right breast upper outer quadrant. These were evaluated with ultrasound and were felt to be benign. Bone density 07/17/2013 [facility] [place] was normal  Assessment: 53 y.o.  [place] woman status post left lumpectomy and sentinel lymph node sampling September 2012 for a T1b N0, Stage IA invasive ductal carcinoma, grade 1, which was strongly estrogen and progesterone receptor positive, HER-2 negative  with an MIB-1 of 15%.    (1)  She completed radiation in December 2012 in the I will in and will looked as though there are no orders will be ordered in the the last 1 and your liver (2) started tamoxifen December 2012, stopped October 2014   (3) started anastrozole December 2014; DEXA scan at [facility] 07/17/2013 was normal, with a T score of -0.9    Plan:  [name] is going straight from this visit to work with a moving truck and they're getting older furniture up to [place]. She has a ready identified an OB/GYN group that has a special interest in working with menopausal issues and also has an Materials engineer and a counselor involved. This is very favorable.  I copied all her records today and gave them to her so there should be no in her care, but of course if they wish to contact us for any reason we will be glad to further facilitate that.  She is aware that her mood is not stable at this point. We are opting to venlafaxine to 75 mg daily. She does find that the lower dose help so a little bit more should make life better. She is having 2-4 glasses of wine a day, which I think is a little much. Temporarily of course that's just a coping mechanism. I suggested she keep it down to 5-10 glasses of wine a week. She is binge eating and then running 7 miles. However she is maintaining her weight and looks very healthy. Both she and her husband are undergoing quite a bit of stress unfortunately they have not sold their home here yet so the stress is likely to continue for some time.  The good news is that she is thinking proactively. She may visit an [employer] in her [place] home, joined a gym, possibly joining the club, may be joined a couple of all clots, and start making a social life her herself.  We are not making any further appointments for her here, but she knows that we'll be glad to see her at any point or troubleshoot  any problem distance if necessary  Nikiya Starn C 03/31/2014

## 2014-09-26 NOTE — Telephone Encounter (Signed)
none

## 2015-04-06 ENCOUNTER — Other Ambulatory Visit: Payer: Self-pay | Admitting: Oncology

## 2017-10-11 NOTE — Progress Notes (Signed)
This encounter was created in error - please disregard.
